# Patient Record
Sex: Male | Born: 1970 | Hispanic: No | Marital: Single | Smoking: Never smoker
Health system: Southern US, Community
[De-identification: ages and names within clinical notes are randomized; demographics above are authoritative.]

## PROBLEM LIST (undated history)

## (undated) DIAGNOSIS — F209 Schizophrenia, unspecified: Secondary | ICD-10-CM

## (undated) DIAGNOSIS — F79 Unspecified intellectual disabilities: Secondary | ICD-10-CM

## (undated) HISTORY — DX: Schizophrenia, unspecified: F20.9

---

## 1991-05-03 DIAGNOSIS — F209 Schizophrenia, unspecified: Secondary | ICD-10-CM

## 1991-05-03 HISTORY — DX: Schizophrenia, unspecified: F20.9

## 1998-02-12 ENCOUNTER — Encounter: Admission: RE | Admit: 1998-02-12 | Discharge: 1998-02-12 | Payer: Self-pay | Admitting: Family Medicine

## 1998-03-11 ENCOUNTER — Encounter: Admission: RE | Admit: 1998-03-11 | Discharge: 1998-03-11 | Payer: Self-pay | Admitting: Family Medicine

## 1998-03-23 ENCOUNTER — Encounter: Admission: RE | Admit: 1998-03-23 | Discharge: 1998-03-23 | Payer: Self-pay | Admitting: Family Medicine

## 1999-12-06 ENCOUNTER — Encounter: Admission: RE | Admit: 1999-12-06 | Discharge: 1999-12-06 | Payer: Self-pay | Admitting: Family Medicine

## 2003-09-22 ENCOUNTER — Encounter: Admission: RE | Admit: 2003-09-22 | Discharge: 2003-09-22 | Payer: Self-pay | Admitting: Family Medicine

## 2003-10-23 ENCOUNTER — Encounter: Admission: RE | Admit: 2003-10-23 | Discharge: 2003-10-23 | Payer: Self-pay | Admitting: Sports Medicine

## 2004-12-03 ENCOUNTER — Ambulatory Visit: Payer: Self-pay | Admitting: Family Medicine

## 2006-03-15 ENCOUNTER — Ambulatory Visit: Payer: Self-pay | Admitting: Family Medicine

## 2006-05-15 ENCOUNTER — Ambulatory Visit: Payer: Self-pay | Admitting: Family Medicine

## 2006-05-31 ENCOUNTER — Ambulatory Visit (HOSPITAL_COMMUNITY): Admission: RE | Admit: 2006-05-31 | Discharge: 2006-05-31 | Payer: Self-pay | Admitting: Sports Medicine

## 2006-06-01 ENCOUNTER — Ambulatory Visit: Payer: Self-pay | Admitting: Family Medicine

## 2006-06-29 DIAGNOSIS — F209 Schizophrenia, unspecified: Secondary | ICD-10-CM | POA: Insufficient documentation

## 2006-06-29 DIAGNOSIS — E669 Obesity, unspecified: Secondary | ICD-10-CM | POA: Insufficient documentation

## 2007-03-26 ENCOUNTER — Ambulatory Visit: Payer: Self-pay | Admitting: Family Medicine

## 2007-04-09 ENCOUNTER — Encounter (INDEPENDENT_AMBULATORY_CARE_PROVIDER_SITE_OTHER): Payer: Self-pay | Admitting: Family Medicine

## 2007-04-09 ENCOUNTER — Ambulatory Visit: Payer: Self-pay | Admitting: Family Medicine

## 2007-04-09 LAB — CONVERTED CEMR LAB
Cholesterol: 173 mg/dL (ref 0–200)
LDL Cholesterol: 112 mg/dL — ABNORMAL HIGH (ref 0–99)
Total CHOL/HDL Ratio: 3.6
VLDL: 13 mg/dL (ref 0–40)

## 2008-06-09 ENCOUNTER — Encounter: Payer: Self-pay | Admitting: Family Medicine

## 2008-06-09 ENCOUNTER — Ambulatory Visit: Payer: Self-pay | Admitting: Family Medicine

## 2008-06-10 LAB — CONVERTED CEMR LAB
Albumin: 4.4 g/dL (ref 3.5–5.2)
Alkaline Phosphatase: 77 units/L (ref 39–117)
BUN: 13 mg/dL (ref 6–23)
CO2: 27 meq/L (ref 19–32)
Calcium: 9.5 mg/dL (ref 8.4–10.5)
Chloride: 106 meq/L (ref 96–112)
Cholesterol: 174 mg/dL (ref 0–200)
Glucose, Bld: 82 mg/dL (ref 70–99)
HDL: 38 mg/dL — ABNORMAL LOW (ref >39)
Hemoglobin: 15.4 g/dL (ref 13.0–17.0)
LDL Cholesterol: 115 mg/dL — ABNORMAL HIGH (ref 0–99)
Potassium: 4.9 meq/L (ref 3.5–5.3)
RBC: 5.37 M/uL (ref 4.22–5.81)
Sodium: 142 meq/L (ref 135–145)
Total Protein: 7.2 g/dL (ref 6.0–8.3)
Triglycerides: 103 mg/dL (ref <150)
WBC: 4.3 10*3/uL (ref 4.0–10.5)

## 2009-06-15 ENCOUNTER — Ambulatory Visit: Payer: Self-pay | Admitting: Family Medicine

## 2009-06-15 ENCOUNTER — Encounter: Payer: Self-pay | Admitting: Family Medicine

## 2009-06-16 LAB — CONVERTED CEMR LAB
AST: 12 units/L (ref 0–37)
BUN: 11 mg/dL (ref 6–23)
Calcium: 8.9 mg/dL (ref 8.4–10.5)
Chloride: 105 meq/L (ref 96–112)
Cholesterol: 158 mg/dL (ref 0–200)
Creatinine, Ser: 0.97 mg/dL (ref 0.40–1.50)
HCT: 40.9 % (ref 39.0–52.0)
HDL: 37 mg/dL — ABNORMAL LOW (ref >39)
Hemoglobin: 14 g/dL (ref 13.0–17.0)
MCV: 84 fL (ref 78.0–100.0)
RDW: 15.5 % (ref 11.5–15.5)
Total Bilirubin: 0.6 mg/dL (ref 0.3–1.2)
Total CHOL/HDL Ratio: 4.3
VLDL: 11 mg/dL (ref 0–40)

## 2009-07-12 ENCOUNTER — Encounter: Payer: Self-pay | Admitting: Family Medicine

## 2010-04-13 ENCOUNTER — Encounter: Payer: Self-pay | Admitting: Family Medicine

## 2010-05-26 ENCOUNTER — Encounter: Payer: Self-pay | Admitting: Family Medicine

## 2010-05-26 ENCOUNTER — Ambulatory Visit: Admission: RE | Admit: 2010-05-26 | Discharge: 2010-05-26 | Payer: Self-pay | Source: Home / Self Care

## 2010-05-26 DIAGNOSIS — R635 Abnormal weight gain: Secondary | ICD-10-CM | POA: Insufficient documentation

## 2010-06-01 NOTE — Miscellaneous (Signed)
Summary: med update  Clinic notes obtained from [facility].  Medication updated............Marland KitchenMarisue Ivan, MD  Clinical Lists Changes  Medications: Changed medication from ZYPREXA 10 MG TABS (OLANZAPINE) one tablet by mouth daily to ZYPREXA 15 MG TABS (OLANZAPINE) 1 tab by mouth qhs

## 2010-06-01 NOTE — Assessment & Plan Note (Signed)
Summary: 40yo M wellness visit- 85lb wt loss   Vital Signs:  Patient profile:   40 year old male Height:      70.25 inches Weight:      165.6 pounds BMI:     23.68 Temp:     97.6 degrees F oral Pulse rate:   56 / minute BP sitting:   112 / 73  (left arm) Cuff size:   regular  Vitals Entered By: Gladstone Pih (June 15, 2009 2:01 PM) CC: CPE Is Patient Diabetic? No Pain Assessment Patient in pain? no        Primary Care Provider:  Marisue Ivan  MD  CC:  CPE.  History of Present Illness: 40yo M here for annual check up  Obesity: Lost 85lbs in 1 year w/ exercise and diet.    Schizophrenia: Doing well on the Zyprexa.  No hallucinations.  Followed at [facility].    Preventative: Desires flu vaccination.  Unsure of last tetanus.  Habits & Providers  Alcohol-Tobacco-Diet     Tobacco Status: never  Current Medications (verified): 1)  Zyprexa 10 Mg Tabs (Olanzapine) .... 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   Lives here in [place] with his mother but goes back to [place] during the year. No tobacco No EtOH No illcit drugs Exercise daily and Diet Best contact # [phone] (home), [phone] (mom's cell)  Review of Systems       No hallucinations.    Physical Exam  General:  VS Reviewed. Well appearing, NAD. Drastic weight loss since last visit.  Eyes:  EOMI Mouth:  Oral mucosa and oropharynx without lesions or exudates.  Teeth in good repair. Neck:  supple, full ROM, no goiter or mass  Lungs:  Normal respiratory effort, chest expands symmetrically. Lungs are clear to auscultation, no crackles or wheezes. Heart:  Normal  rate and regular rhythm. S1 and S2 normal without gallop, murmur, click, rub or other extra sounds. Abdomen:  Soft, NT, ND, no HSM, active BS excess skin  Extremities:  no edema Neurologic:  no focal deficits Cervical Nodes:  no LAD Psych:  no hallucinations nl affect   Impression & Recommendations:  Problem # 1:  WELL ADULT EXAM (ICD-V70.0) Assessment Unchanged  Pt is more healthy now than he was 1 year ago.  85lb wt loss with exercise and diet.  Will check CMET, CBC, and lipid panel given his hx of obesity and currently on zyprexa.  Will provide dtap and flu vaccination today.  Will f/u on an as needed basis.    Orders: [facility] - Est  18-39 yrs (30865)  Problem # 2:  SCHIZOPHRENIA (ICD-295.90) Assessment: Unchanged Well controlled on Zyrexa.  Followed by [facility].  Orders: Comp Met-[facility] [phone]) CBC-[facility] (84132)  Problem # 3:  OBESITY, NOS (ICD-278.00) Assessment: Improved 85lb wt loss w/ exercise and diet.  Orders: CBC-[facility] (44010) Lipid-[facility] ([id number])  Complete Medication List: 1)  Zyprexa 10 Mg Tabs (Olanzapine) .... One tablet by mouth daily  Patient Instructions: 1)  Please schedule a follow-up appointment as needed otherwise I will see you on an annual basis. 2)  Congratulations on the weight loss.   Prevention & Chronic Care Immunizations   Influenza  vaccine: Not documented    Tetanus booster: Not documented    Pneumococcal vaccine: Not documented  Other Screening   Smoking status: never  (06/15/2009)  Lipids   Total Cholesterol: 174  (06/09/2008)   LDL: 115  (06/09/2008)   LDL Direct: Not documented   HDL: 38  (06/09/2008)   Triglycerides: 103  (06/09/2008)   Nursing Instructions: Give Flu vaccine today Give tetanus booster today    Appended Document: 40yo M wellness visit- 85lb wt loss   Immunizations Administered:  Influenza Vaccine # 1:    Vaccine Type: Fluvax 3+    Site: left deltoid    Mfr: GlaxoSmithKline    Dose: 0.5  ml    Route: IM    Given by: Gladstone Pih    Exp. Date: 10/29/2009    Lot #: [id number]    VIS given: 01/2009    Physician counseled: yes  Tetanus Vaccine:    Vaccine Type: Tdap    Site: right deltoid    Mfr: GlaxoSmithKline    Dose: 0.5 ml    Route: IM    Given by: Gladstone Pih    Exp. Date: 06/27/2011    Lot #: [id number]    VIS given: 03/20/07 version given June 15, 2009.    Physician counseled: yes  Flu Vaccine Consent Questions:    Do you have a history of severe allergic reactions to this vaccine? no    Any prior history of allergic reactions to egg and/or gelatin? no    Do you have a sensitivity to the preservative Thimersol? no    Do you have a past history of Guillan-Barre Syndrome? no    Do you currently have an acute febrile illness? no    Have you ever had a severe reaction to latex? no    Vaccine information given and explained to patient? yes

## 2010-06-02 ENCOUNTER — Encounter: Payer: Self-pay | Admitting: *Deleted

## 2010-06-03 NOTE — Consult Note (Signed)
Summary: [facility]   Imported By: De Nurse 04/27/2010 11:32:41  _____________________________________________________________________  External Attachment:    Type:   Image     Comment:   External Document  Appended Document: [facility]    Clinical Lists Changes  Observations: Added new observation of PRIMECMT: WBC 3.1 (04/09/2010 14:03) Added new observation of PLATELETK/UL: 173 K/uL (04/09/2010 14:03) Added new observation of HGB: 13.3 g/dL (16/01/9603 54:09)       -  Date:  04/09/2010    HGB 13.3    PLTS 173    Primary Care Comment: WBC 3.1

## 2010-06-09 NOTE — Assessment & Plan Note (Signed)
Summary: CPE, schizophrenia, weight loss.    Vital Signs:  Patient profile:   40 year old male Height:      70.25 inches Weight:      150.8 pounds BMI:     21.56 Temp:     98.2 degrees F oral Pulse rate:   54 / minute BP sitting:   103 / 64  (left arm) Cuff size:   regular  Vitals Entered By: Jimmy Footman, CMA (May 26, 2010 1:57 PM) CC: CPE Is Patient Diabetic? No Pain Assessment Patient in pain? no        Primary Care Provider:  Jamie Brookes MD  CC:  CPE.  History of Present Illness: PT comes with a family member.   Mental Health: PT is a 40 y/o schizophrenic male who is well controlled on meds given by [facility]. He has been taking olanzepine and doing well on it but has had some leukopenia the last 2 times his labs were drawn. They are adjusting his meds as needed.   Weight loss: Pt has lost an amazing amount of weight. At his heaviest he weighed 353 lbs. He is now 150.8 lbs and feels well. He has done it by exercising and eating correctly.   Otherwise the patient is doing well and feeling well.   Habits & Providers  Alcohol-Tobacco-Diet     Tobacco Status: never  Current Medications (verified): 1)  Perphenazine 8 Mg Tabs (Perphenazine) .... Take 1 Pill Qhs  Allergies (verified): No Known Drug Allergies  Review of Systems        vitals reviewed and pertinent negatives and positives seen in HPI   Physical Exam  General:  Well-developed,well-nourished,in no acute distress; alert,appropriate and cooperative throughout examination Head:  Normocephalic and atraumatic without obvious abnormalities. No apparent alopecia or balding. Ears:  Rt ear cerumen removed, Tm's normal bilatearly Nose:  External nasal examination shows no deformity or inflammation. Nasal mucosa are pink and moist without lesions or exudates. Mouth:  Oral mucosa and oropharynx without lesions or exudates.  Teeth in good repair. Neck:  No deformities, masses, or tenderness  noted. Lungs:  Normal respiratory effort, chest expands symmetrically. Lungs are clear to auscultation, no crackles or wheezes. Heart:  Normal rate and regular rhythm. S1 and S2 normal without gallop, murmur, click, rub or other extra sounds. Abdomen:  Bowel sounds positive,abdomen soft and non-tender without masses, organomegaly or hernias noted. Msk:  No deformity or scoliosis noted of thoracic or lumbar spine.   Pulses:  R and L radial and posterior tibial pulses are full and equal bilaterally Extremities:  No clubbing, cyanosis, edema, or deformity noted with normal full range of motion of all joints.   Skin:  Intact without suspicious lesions or rashes Psych:  flat affect.     Impression & Recommendations:  Problem # 1:  WELL ADULT EXAM (ICD-V70.0) Assessment Unchanged Pt is doing well. He has no concerns or complaints.   Orders: [facility] - Est  18-39 yrs (16109)  Problem # 2:  WEIGHT LOSS (ICD-783.21) Assessment: Improved Plan to check a few labs to make sure the patient is still doing well with his weight loss. Will also check TSH. PT is not fasting today so he will get labs done in the future.   Orders: [facility] - Est  18-39 yrs ([id number])Future Orders: Lipid-[facility] ([id number]) ... 05/18/2011 Comp Met-[facility] ([id number]) ... 05/12/2011  Complete Medication List: 1)  Perphenazine 8 Mg Tabs (Perphenazine) .... Take 1 pill qhs  Other Orders:  TSH-[facility] [phone])   Orders Added: 1)  Lipid-[facility] [[id number]] 2)  Comp Met-[facility] [[id number]] 3)  TSH-[facility] [[id number]] 4)  [facility] - Est  18-39 yrs [84132]

## 2010-06-21 ENCOUNTER — Other Ambulatory Visit: Payer: Self-pay

## 2010-06-22 ENCOUNTER — Other Ambulatory Visit: Payer: Medicaid Other

## 2010-06-22 DIAGNOSIS — R634 Abnormal weight loss: Secondary | ICD-10-CM

## 2010-06-22 LAB — CONVERTED CEMR LAB
AST: 15 units/L (ref 0–37)
Alkaline Phosphatase: 51 units/L (ref 39–117)
BUN: 16 mg/dL (ref 6–23)
Calcium: 9.3 mg/dL (ref 8.4–10.5)
Creatinine, Ser: 0.93 mg/dL (ref 0.40–1.50)
HDL: 53 mg/dL (ref >39)
LDL Cholesterol: 69 mg/dL (ref 0–99)
Total Bilirubin: 0.7 mg/dL (ref 0.3–1.2)
Total CHOL/HDL Ratio: 2.5

## 2010-06-22 LAB — COMPREHENSIVE METABOLIC PANEL
CO2: 28 mEq/L (ref 19–32)
Creat: 0.93 mg/dL (ref 0.40–1.50)
Glucose, Bld: 77 mg/dL (ref 70–99)
Total Bilirubin: 0.7 mg/dL (ref 0.3–1.2)

## 2010-06-22 LAB — LIPID PANEL
Cholesterol: 132 mg/dL (ref 0–200)
HDL: 53 mg/dL (ref >39)
Total CHOL/HDL Ratio: 2.5 Ratio
Triglycerides: 52 mg/dL (ref <150)
VLDL: 10 mg/dL (ref 0–40)

## 2010-06-23 ENCOUNTER — Telehealth: Payer: Self-pay | Admitting: Family Medicine

## 2010-06-23 NOTE — Telephone Encounter (Signed)
Informed pt of nl results.Charles Hernandez

## 2010-09-23 ENCOUNTER — Encounter: Payer: Self-pay | Admitting: Sports Medicine

## 2010-09-23 ENCOUNTER — Ambulatory Visit (INDEPENDENT_AMBULATORY_CARE_PROVIDER_SITE_OTHER): Payer: Medicaid Other | Admitting: Sports Medicine

## 2010-09-23 VITALS — BP 92/60 | HR 62 | Temp 97.4°F | Ht 70.0 in | Wt 155.0 lb

## 2010-09-23 DIAGNOSIS — K6289 Other specified diseases of anus and rectum: Secondary | ICD-10-CM | POA: Insufficient documentation

## 2010-09-23 MED ORDER — ALUMINUM CHLORIDE 20 % EX SOLN: Freq: Every day | CUTANEOUS | Status: DC

## 2010-09-23 NOTE — Patient Instructions (Signed)
Great to see you, Use the medication below as prescribed. Come back to see me if no better in 2 weeks. If the medication is too expensive you may try a deodorant with aluminum such as Gilette Clinical strength.  Ihor Austin. Benjamin Stain, M.D.

## 2010-09-23 NOTE — Progress Notes (Signed)
  Subjective:    Patient ID: Charles Hernandez, male    DOB: 01/13/1971, 40 y.o.   MRN: [id number]  HPI Skin irritation:  Pt notes that for 1-2 weeks the skin around his rectum has been peeling.  Notes this when bathing and when wiping after stooling.  No pain when stooling, no fevers/chills.  Lost >200 lbs recently and has lots of skin hanging.  Notes sweats a lot particularly since the weather has changed.   Review of Systems See HPI    Objective:   Physical Exam  Constitutional: He appears well-developed and well-nourished. No distress.  Genitourinary:     Skin: Skin is warm and dry.          Assessment & Plan:

## 2010-09-23 NOTE — Assessment & Plan Note (Signed)
Gluteal skin causing sweating->maceration/peeling.   Pt to try to keep skin dry/clean. Will rx Drysol topical, if too expensive may use deodorant containing aluminum such as Gilette clinical strength in gluteal cleft. RTC if no better in 2 weeks and can consider oral treatment such as robinul.

## 2010-10-12 ENCOUNTER — Telehealth: Payer: Self-pay | Admitting: Family Medicine

## 2010-10-12 DIAGNOSIS — K6289 Other specified diseases of anus and rectum: Secondary | ICD-10-CM

## 2010-10-12 NOTE — Telephone Encounter (Signed)
I can't speak to him now but if he could just tell my nurse how things are going then I can follow up with him that way.

## 2010-10-12 NOTE — Telephone Encounter (Signed)
Pt called to speak with you regarding progress for his skin irritation.  Please call him back at contact number given or his mom's cell # at [phone]

## 2010-10-20 MED ORDER — ALUMINUM CHLORIDE 20 % EX SOLN: Freq: Every day | CUTANEOUS | Status: AC

## 2010-10-20 NOTE — Telephone Encounter (Signed)
Multiple refills called in.

## 2010-10-20 NOTE — Telephone Encounter (Signed)
Pt said that he is healing and would probably need another refill just in case he have another outbreak.

## 2010-10-21 NOTE — Telephone Encounter (Signed)
Informed pt that rx was called in.Laureen Ochs, Viann Shove

## 2011-06-01 ENCOUNTER — Encounter: Payer: Medicaid Other | Admitting: Family Medicine

## 2011-06-22 ENCOUNTER — Encounter: Payer: Self-pay | Admitting: Family Medicine

## 2011-06-22 ENCOUNTER — Ambulatory Visit (INDEPENDENT_AMBULATORY_CARE_PROVIDER_SITE_OTHER): Payer: Medicaid Other | Admitting: Family Medicine

## 2011-06-22 ENCOUNTER — Telehealth: Payer: Self-pay | Admitting: *Deleted

## 2011-06-22 DIAGNOSIS — L708 Other acne: Secondary | ICD-10-CM

## 2011-06-22 DIAGNOSIS — L709 Acne, unspecified: Secondary | ICD-10-CM

## 2011-06-22 DIAGNOSIS — Z0000 Encounter for general adult medical examination without abnormal findings: Secondary | ICD-10-CM

## 2011-06-22 DIAGNOSIS — J069 Acute upper respiratory infection, unspecified: Secondary | ICD-10-CM

## 2011-06-22 DIAGNOSIS — D72829 Elevated white blood cell count, unspecified: Secondary | ICD-10-CM

## 2011-06-22 LAB — CBC WITH DIFFERENTIAL/PLATELET
Basophils Relative: 0 % (ref 0–1)
Eosinophils Absolute: 0.3 10*3/uL (ref 0.0–0.7)
Eosinophils Relative: 4 % (ref 0–5)
Lymphs Abs: 1.6 10*3/uL (ref 0.7–4.0)
MCH: 30.3 pg (ref 26.0–34.0)
MCHC: 33.3 g/dL (ref 30.0–36.0)
MCV: 91 fL (ref 78.0–100.0)
Platelets: 257 10*3/uL (ref 150–400)
RBC: 5.22 MIL/uL (ref 4.22–5.81)
RDW: 13.7 % (ref 11.5–15.5)

## 2011-06-22 LAB — COMPREHENSIVE METABOLIC PANEL
ALT: 14 U/L (ref 0–53)
Alkaline Phosphatase: 84 U/L (ref 39–117)
CO2: 28 mEq/L (ref 19–32)
Creat: 0.91 mg/dL (ref 0.50–1.35)
Total Bilirubin: 0.5 mg/dL (ref 0.3–1.2)

## 2011-06-22 MED ORDER — CLINDAMYCIN PHOS-BENZOYL PEROX 1.2-5 % EX GEL: 45.0000 g | Freq: Two times a day (BID) | CUTANEOUS | Status: AC

## 2011-06-22 NOTE — Patient Instructions (Signed)
It was nice to meet you today. Call our office in 48 hours for lab results. Go to local pharmacy and pick up Duac ointment for your skin and apply twice daily. You may purchase the following over the counter medications: - Fiber supplements or Miralax for constipation - Cetaphil or Eucerin facial moisturizer for dry skin - Tylenol 500 mg 1 tablet every 8 hours as needed for headache - DayQuil and/or Nyquil for headache, runny, nose - Cepacol cough drops for sore throat  Please return to clinic in 3-4 weeks for follow up. Schedule an appointment with Dr. Gerilyn Pilgrim for Nutrition.

## 2011-06-22 NOTE — Telephone Encounter (Signed)
lvm informing pt that clindamycin is not covered with his Insurance. I will speak with Dr.de Lawson Radar about an alternative.Loralee Pacas [place]

## 2011-06-23 MED ORDER — TRETINOIN 0.01 % EX GEL: Freq: Every day | CUTANEOUS | Status: AC

## 2011-06-23 NOTE — Telephone Encounter (Signed)
Let's try this medication instead.  I sent Rx to his pharmacy.  Please inform.  Thanks.

## 2011-06-24 ENCOUNTER — Telehealth: Payer: Self-pay | Admitting: *Deleted

## 2011-06-24 NOTE — Telephone Encounter (Signed)
PA required for tretinoin gel. Form placed in MD box.

## 2011-06-26 ENCOUNTER — Encounter: Payer: Self-pay | Admitting: Family Medicine

## 2011-06-26 DIAGNOSIS — L709 Acne, unspecified: Secondary | ICD-10-CM | POA: Insufficient documentation

## 2011-06-26 DIAGNOSIS — J069 Acute upper respiratory infection, unspecified: Secondary | ICD-10-CM | POA: Insufficient documentation

## 2011-06-26 NOTE — Progress Notes (Signed)
  Subjective:    Patient ID: Charles Hernandez, male    DOB: January 14, 1971, 41 y.o.   MRN: [id number]  HPI  Patient presents to clinic to meet new MD and annual physical.  He complains of acne and worsening break outs thsi winter and would like to a referral to dermatologist.  He has a photo shoot in late April and wants clear skin prior to the shoot.  He also complains of congestion, sore throat, and runny nose that started 3 days ago.  He was visiting family last weekend and his nephew was sick.  Otherwise, patient denies any fever, chills, NS, chest pain, SOB, abdominal pain, diarrhea, or dysuria.  He is constipated but admits to not eating a high fiber diet.  Patient also wants blood work checked today and faxed to his psychiatrist @ [place] [phone]).    Review of Systems  PER HPI    Objective:   Physical Exam  Constitutional: No distress.  HENT:  Head: Normocephalic and atraumatic.  Neck: Normal range of motion.  Cardiovascular: Normal heart sounds.   No murmur heard. Pulmonary/Chest: Effort normal and breath sounds normal. He has no wheezes. He has no rales.  Abdominal: Soft. Bowel sounds are normal. He exhibits no distension. There is no tenderness.  Musculoskeletal: Normal range of motion. He exhibits no edema and no tenderness.  Neurological: He is alert. No cranial nerve deficit.  Skin:       Dry skin and pimples worse on R side of face > L side.  No cystic acne, redness, or pus.          Assessment & Plan:

## 2011-06-26 NOTE — Assessment & Plan Note (Signed)
Conservative management and red flags given.  Refer to AVS

## 2011-06-26 NOTE — Assessment & Plan Note (Signed)
Duac gel for breakouts and cetaphil or eucerin facial lotion BID for dry skin.

## 2011-06-29 NOTE — Telephone Encounter (Signed)
Approval received from insurance.  Pharmacy notified. 

## 2011-07-20 ENCOUNTER — Ambulatory Visit: Payer: Medicaid Other | Admitting: Family Medicine

## 2012-05-07 ENCOUNTER — Ambulatory Visit (INDEPENDENT_AMBULATORY_CARE_PROVIDER_SITE_OTHER): Payer: Medicare Other | Admitting: *Deleted

## 2012-05-07 DIAGNOSIS — Z23 Encounter for immunization: Secondary | ICD-10-CM

## 2012-05-19 ENCOUNTER — Encounter (HOSPITAL_COMMUNITY): Payer: Self-pay

## 2012-05-19 ENCOUNTER — Emergency Department (INDEPENDENT_AMBULATORY_CARE_PROVIDER_SITE_OTHER)
Admission: EM | Admit: 2012-05-19 | Discharge: 2012-05-19 | Disposition: A | Discharge status: Home / Self Care | Payer: Medicare Other | Source: Home / Self Care | Attending: Family Medicine | Admitting: Family Medicine

## 2012-05-19 DIAGNOSIS — K0889 Other specified disorders of teeth and supporting structures: Secondary | ICD-10-CM

## 2012-05-19 DIAGNOSIS — K089 Disorder of teeth and supporting structures, unspecified: Secondary | ICD-10-CM

## 2012-05-19 HISTORY — DX: Unspecified intellectual disabilities: F79

## 2012-05-19 MED ORDER — DICLOFENAC POTASSIUM 50 MG PO TABS: 50.0000 mg | ORAL_TABLET | Freq: Three times a day (TID) | ORAL | Status: DC

## 2012-05-19 MED ORDER — CLINDAMYCIN HCL 150 MG PO CAPS: 150.0000 mg | ORAL_CAPSULE | Freq: Four times a day (QID) | ORAL | Status: DC

## 2012-05-19 NOTE — ED Notes (Signed)
Reportedly has been told one of his lower right molars needs to be removed; pain has been worse past 2-3 days , no relief w OTC medication; no appointment yet for dental care

## 2012-05-19 NOTE — ED Provider Notes (Signed)
History     CSN: [id number]  Arrival date & time 05/19/12  1146   First MD Initiated Contact with Patient 05/19/12 1149      Chief Complaint  Patient presents with  . Dental Pain    (Consider location/radiation/quality/duration/timing/severity/associated sxs/prior treatment) Patient is a 42 y.o. male presenting with tooth pain. The history is provided by the patient.  Dental PainThe primary symptoms include mouth pain. Primary symptoms do not include headaches or fever. The symptoms began 3 to 5 days ago. The symptoms are worsening.  Additional symptoms include: dental sensitivity to temperature.    Past Medical History  Diagnosis Date  . Mental disability     History reviewed. No pertinent past surgical history.  History reviewed. No pertinent family history.  History  Substance Use Topics  . Smoking status: Never Smoker   . Smokeless tobacco: Not on file  . Alcohol Use: Not on file      Review of Systems  Constitutional: Negative.  Negative for fever.  HENT: Positive for dental problem.   Neurological: Negative for headaches.    Allergies  Review of patient's allergies indicates no known allergies.  Home Medications   Current Outpatient Rx  Name  Route  Sig  Dispense  Refill  . CLINDAMYCIN HCL 150 MG PO CAPS   Oral   Take 1 capsule (150 mg total) by mouth 4 (four) times daily.   28 capsule   0   . CLINDAMYCIN PHOS-BENZOYL PEROX 1.2-5 % EX GEL   Topical   Apply 45 g topically 2 (two) times daily.   45 g   0   . DICLOFENAC POTASSIUM 50 MG PO TABS   Oral   Take 1 tablet (50 mg total) by mouth 3 (three) times daily. For dental pain   30 tablet   0   . OLANZAPINE 15 MG PO TABS   Oral   Take 15 mg by mouth at bedtime.           . TRETINOIN 0.01 % EX GEL   Topical   Apply topically at bedtime.   45 g   0     BP 110/76  Pulse 53  Temp 98.8 F (37.1 C) (Oral)  Resp 16  SpO2 97%  Physical Exam  Nursing note and vitals  reviewed. Constitutional: He appears well-developed and well-nourished.  HENT:  Head: Normocephalic.  Right Ear: External ear normal.  Left Ear: External ear normal.  Mouth/Throat: Oropharynx is clear and moist. No oropharyngeal exudate.      ED Course  Procedures (including critical care time)  Labs Reviewed - No data to display No results found.   1. Pain, dental       MDM          Linna Hoff, MD 05/19/12 [phone]

## 2012-05-21 NOTE — ED Notes (Signed)
[facility] called re: pt stating diclofenac is too expensive. Per Dr. Artis Flock, rx changed to Ibuprofen 800mg  #30 TID as needed for pain control. Pharmacist notified of this changed and to destroy the diclofenac rx.

## 2012-08-24 ENCOUNTER — Encounter: Payer: Self-pay | Admitting: Family Medicine

## 2012-08-24 ENCOUNTER — Ambulatory Visit (INDEPENDENT_AMBULATORY_CARE_PROVIDER_SITE_OTHER): Payer: Medicare Other | Admitting: Family Medicine

## 2012-08-24 VITALS — BP 103/52 | HR 54 | Temp 99.0°F | Wt 243.0 lb

## 2012-08-24 DIAGNOSIS — E669 Obesity, unspecified: Secondary | ICD-10-CM

## 2012-08-24 DIAGNOSIS — R635 Abnormal weight gain: Secondary | ICD-10-CM

## 2012-08-24 DIAGNOSIS — F209 Schizophrenia, unspecified: Secondary | ICD-10-CM

## 2012-08-24 LAB — COMPREHENSIVE METABOLIC PANEL
Albumin: 3.8 g/dL (ref 3.5–5.2)
CO2: 26 mEq/L (ref 19–32)
Glucose, Bld: 70 mg/dL (ref 70–99)
Potassium: 4.5 mEq/L (ref 3.5–5.3)
Sodium: 140 mEq/L (ref 135–145)
Total Bilirubin: 0.7 mg/dL (ref 0.3–1.2)
Total Protein: 6.5 g/dL (ref 6.0–8.3)

## 2012-08-24 LAB — CBC
Platelets: 239 10*3/uL (ref 150–400)
RBC: 4.94 MIL/uL (ref 4.22–5.81)
WBC: 3.9 10*3/uL — ABNORMAL LOW (ref 4.0–10.5)

## 2012-08-24 LAB — TSH: TSH: 2.012 u[IU]/mL (ref 0.350–4.500)

## 2012-08-24 MED ORDER — TRETINOIN MICROSPHERE 0.1 % EX GEL: Freq: Every day | CUTANEOUS | Status: DC

## 2012-08-24 NOTE — Progress Notes (Signed)
  Subjective:    Patient ID: Charles Hernandez, male    DOB: 11/06/1970, 41 y.o.   MRN: [id number]  HPI  Patient presents to clinic for annual physical.  Weight gain:  Patient says he he has gained weight ever since he was in [place].  He said he was sick with a viral bug for a few weeks.  He was seen at an Urgent Care center in [place], but does not recall the diagnosis.  He admits to not being very active since he has been recovering from illness, but plans to start exercise regimen and eat better.  Denies any viral symptoms at this time.  Schizophrenia:  Patient has a psychiatrist.  He takes Zyprexa and has no complaints.  Denies any visual or auditory hallucinations.  He needs blood work today to be sent to psychiatrist.  No other concerns.  Past Medical History  Diagnosis Date  . Mental disability   . Schizophrenia 1993   Family History  Problem Relation Age of Onset  . Cancer Mother   . Cancer Sister    He lives in [place] now, but visits family in [place] often.  He says he is looking for a job.  Never smoker.  . Review of Systems Per HPI    Objective:   Physical Exam  Constitutional: He appears well-nourished. No distress.  HENT:  Head: Normocephalic and atraumatic.  Right Ear: External ear normal.  Left Ear: External ear normal.  Mouth/Throat: Oropharynx is clear and moist.  Eyes: Conjunctivae are normal. No scleral icterus.  Cardiovascular: Normal rate.   Pulmonary/Chest: Effort normal and breath sounds normal.  Abdominal: Soft. He exhibits no distension. There is no tenderness.  Musculoskeletal: Normal range of motion.  Neurological: No cranial nerve deficit.  Skin: No rash noted.  Psychiatric: He has a normal mood and affect. His behavior is normal. Thought content normal.      Assessment & Plan:

## 2012-08-24 NOTE — Patient Instructions (Addendum)
It was great to see you again. I will send you a letter with your recent lab results. Retin-A gel was sent to your pharmacy.  Call if too expensive. Schedule next annual physical in ONE year.  Preventive Care for Adults, Male A healthy lifestyle and preventive care can promote health and wellness. Preventive health guidelines for men include the following key practices:  A routine yearly physical is a good way to check with your caregiver about your health and preventative screening. It is a chance to share any concerns and updates on your health, and to receive a thorough exam.  Visit your dentist for a routine exam and preventative care every 6 months. Brush your teeth twice a day and floss once a day. Good oral hygiene prevents tooth decay and gum disease.  The frequency of eye exams is based on your age, health, family medical history, use of contact lenses, and other factors. Follow your caregiver's recommendations for frequency of eye exams.  Eat a healthy diet. Foods like vegetables, fruits, whole grains, low-fat dairy products, and lean protein foods contain the nutrients you need without too many calories. Decrease your intake of foods high in solid fats, added sugars, and salt. Eat the right amount of calories for you.Get information about a proper diet from your caregiver, if necessary.  Regular physical exercise is one of the most important things you can do for your health. Most adults should get at least 150 minutes of moderate-intensity exercise (any activity that increases your heart rate and causes you to sweat) each week. In addition, most adults need muscle-strengthening exercises on 2 or more days a week.  Maintain a healthy weight. The body mass index (BMI) is a screening tool to identify possible weight problems. It provides an estimate of body fat based on height and weight. Your caregiver can help determine your BMI, and can help you achieve or maintain a healthy  weight.For adults 20 years and older:  A BMI below 18.5 is considered underweight.  A BMI of 18.5 to 24.9 is normal.  A BMI of 25 to 29.9 is considered overweight.  A BMI of 30 and above is considered obese.  Maintain normal blood lipids and cholesterol levels by exercising and minimizing your intake of saturated fat. Eat a balanced diet with plenty of fruit and vegetables. Blood tests for lipids and cholesterol should begin at age 14 and be repeated every 5 years. If your lipid or cholesterol levels are high, you are over 50, or you are a high risk for heart disease, you may need your cholesterol levels checked more frequently.Ongoing high lipid and cholesterol levels should be treated with medicines if diet and exercise are not effective.  If you smoke, find out from your caregiver how to quit. If you do not use tobacco, do not start.  If you choose to drink alcohol, do not exceed 2 drinks per day. One drink is considered to be 12 ounces (355 mL) of beer, 5 ounces (148 mL) of wine, or 1.5 ounces (44 mL) of liquor.  Avoid use of street drugs. Do not share needles with anyone. Ask for help if you need support or instructions about stopping the use of drugs.  High blood pressure causes heart disease and increases the risk of stroke. Your blood pressure should be checked at least every 1 to 2 years. Ongoing high blood pressure should be treated with medicines, if weight loss and exercise are not effective.  If you are 45 to  41 years old, ask your caregiver if you should take aspirin to prevent heart disease.  Diabetes screening involves taking a blood sample to check your fasting blood sugar level. This should be done once every 3 years, after age 16, if you are within normal weight and without risk factors for diabetes. Testing should be considered at a younger age or be carried out more frequently if you are overweight and have at least 1 risk factor for diabetes.  Colorectal cancer can be  detected and often prevented. Most routine colorectal cancer screening begins at the age of 63 and continues through age 46. However, your caregiver may recommend screening at an earlier age if you have risk factors for colon cancer. On a yearly basis, your caregiver may provide home test kits to check for hidden blood in the stool. Use of a small camera at the end of a tube, to directly examine the colon (sigmoidoscopy or colonoscopy), can detect the earliest forms of colorectal cancer. Talk to your caregiver about this at age 5, when routine screening begins. Direct examination of the colon should be repeated every 5 to 10 years through age 35, unless early forms of pre-cancerous polyps or small growths are found.  Hepatitis C blood testing is recommended for all people born from [birth year] through 1965 and any individual with known risks for hepatitis C.  Practice safe sex. Use condoms and avoid high-risk sexual practices to reduce the spread of sexually transmitted infections (STIs). STIs include gonorrhea, chlamydia, syphilis, trichomonas, herpes, HPV, and human immunodeficiency virus (HIV). Herpes, HIV, and HPV are viral illnesses that have no cure. They can result in disability, cancer, and death.  A one-time screening for abdominal aortic aneurysm (AAA) and surgical repair of large AAAs by sound wave imaging (ultrasonography) is recommended for ages 19 to 14 years who are current or former smokers.  Healthy men should no longer receive prostate-specific antigen (PSA) blood tests as part of routine cancer screening. Consult with your caregiver about prostate cancer screening.  Testicular cancer screening is not recommended for adult males who have no symptoms. Screening includes self-exam, caregiver exam, and other screening tests. Consult with your caregiver about any symptoms you have or any concerns you have about testicular cancer.  Use sunscreen with skin protection factor (SPF) of 30 or more.  Apply sunscreen liberally and repeatedly throughout the day. You should seek shade when your shadow is shorter than you. Protect yourself by wearing long sleeves, pants, a wide-brimmed hat, and sunglasses year round, whenever you are outdoors.  Once a month, do a whole body skin exam, using a mirror to look at the skin on your back. Notify your caregiver of new moles, moles that have irregular borders, moles that are larger than a pencil eraser, or moles that have changed in shape or color.  Stay current with required immunizations.  Influenza. You need a dose every fall (or winter). The composition of the flu vaccine changes each year, so being vaccinated once is not enough.  Pneumococcal polysaccharide. You need 1 to 2 doses if you smoke cigarettes or if you have certain chronic medical conditions. You need 1 dose at age 74 (or older) if you have never been vaccinated.  Tetanus, diphtheria, pertussis (Tdap, Td). Get 1 dose of Tdap vaccine if you are younger than age 43 years, are over 31 and have contact with an infant, are a Research scientist (physical sciences), or simply want to be protected from whooping cough. After that, you need a  Td booster dose every 10 years. Consult your caregiver if you have not had at least 3 tetanus and diphtheria-containing shots sometime in your life or have a deep or dirty wound.  HPV. This vaccine is recommended for males 13 through 41 years of age. This vaccine may be given to men 22 through 41 years of age who have not completed the 3 dose series. It is recommended for men through age 38 who have sex with men or whose immune system is weakened because of HIV infection, other illness, or medications. The vaccine is given in 3 doses over 6 months.  Measles, mumps, rubella (MMR). You need at least 1 dose of MMR if you were born in 1957 or later. You may also need a 2nd dose.  Meningococcal. If you are age 64 to 40 years and a Orthoptist living in a residence hall, or  have one of several medical conditions, you need to get vaccinated against meningococcal disease. You may also need additional booster doses.  Zoster (shingles). If you are age 47 years or older, you should get this vaccine.  Varicella (chickenpox). If you have never had chickenpox or you were vaccinated but received only 1 dose, talk to your caregiver to find out if you need this vaccine.  Hepatitis A. You need this vaccine if you have a specific risk factor for hepatitis A virus infection, or you simply wish to be protected from this disease. The vaccine is usually given as 2 doses, 6 to 18 months apart.  Hepatitis B. You need this vaccine if you have a specific risk factor for hepatitis B virus infection or you simply wish to be protected from this disease. The vaccine is given in 3 doses, usually over 6 months. Preventative Service / Frequency Ages 69 to 80  Blood pressure check.** / Every 1 to 2 years.  Lipid and cholesterol check.** / Every 5 years beginning at age 26.  Hepatitis C blood test.** / For any individual with known risks for hepatitis C.  Skin self-exam. / Monthly.  Influenza immunization.** / Every year.  Pneumococcal polysaccharide immunization.** / 1 to 2 doses if you smoke cigarettes or if you have certain chronic medical conditions.  Tetanus, diphtheria, pertussis (Tdap,Td) immunization. / A one-time dose of Tdap vaccine. After that, you need a Td booster dose every 10 years.  HPV immunization. / 3 doses over 6 months, if 26 and younger.  Measles, mumps, rubella (MMR) immunization. / You need at least 1 dose of MMR if you were born in 1957 or later. You may also need a 2nd dose.  Meningococcal immunization. / 1 dose if you are age 37 to 21 years and a Orthoptist living in a residence hall, or have one of several medical conditions, you need to get vaccinated against meningococcal disease. You may also need additional booster doses.  Varicella  immunization.** / Consult your caregiver.  Hepatitis A immunization.** / Consult your caregiver. 2 doses, 6 to 18 months apart.  Hepatitis B immunization.** / Consult your caregiver. 3 doses usually over 6 months. Ages 40 to 10  Blood pressure check.** / Every 1 to 2 years.  Lipid and cholesterol check.** / Every 5 years beginning at age 32.  Fecal occult blood test (FOBT) of stool. / Every year beginning at age 51 and continuing until age 48. You may not have to do this test if you get colonoscopy every 10 years.  Flexible sigmoidoscopy** or colonoscopy.** / Every  5 years for a flexible sigmoidoscopy or every 10 years for a colonoscopy beginning at age 62 and continuing until age 33.  Hepatitis C blood test.** / For all people born from [birth year] through 1965 and any individual with known risks for hepatitis C.  Skin self-exam. / Monthly.  Influenza immunization.** / Every year.  Pneumococcal polysaccharide immunization.** / 1 to 2 doses if you smoke cigarettes or if you have certain chronic medical conditions.  Tetanus, diphtheria, pertussis (Tdap/Td) immunization.** / A one-time dose of Tdap vaccine. After that, you need a Td booster dose every 10 years.  Measles, mumps, rubella (MMR) immunization. / You need at least 1 dose of MMR if you were born in 1957 or later. You may also need a 2nd dose.  Varicella immunization.**/ Consult your caregiver.  Meningococcal immunization.** / Consult your caregiver.  Hepatitis A immunization.** / Consult your caregiver. 2 doses, 6 to 18 months apart.  Hepatitis B immunization.** / Consult your caregiver. 3 doses, usually over 6 months. Ages 32 and over  Blood pressure check.** / Every 1 to 2 years.  Lipid and cholesterol check.**/ Every 5 years beginning at age 79.  Fecal occult blood test (FOBT) of stool. / Every year beginning at age 62 and continuing until age 5. You may not have to do this test if you get colonoscopy every 10  years.  Flexible sigmoidoscopy** or colonoscopy.** / Every 5 years for a flexible sigmoidoscopy or every 10 years for a colonoscopy beginning at age 51 and continuing until age 76.  Hepatitis C blood test.** / For all people born from [birth year] through 1964 and any individual with known risks for hepatitis C.  Abdominal aortic aneurysm (AAA) screening.** / A one-time screening for ages 77 to 32 years who are current or former smokers.  Skin self-exam. / Monthly.  Influenza immunization.** / Every year.  Pneumococcal polysaccharide immunization.** / 1 dose at age 52 (or older) if you have never been vaccinated.  Tetanus, diphtheria, pertussis (Tdap, Td) immunization. / A one-time dose of Tdap vaccine if you are over 65 and have contact with an infant, are a Research scientist (physical sciences), or simply want to be protected from whooping cough. After that, you need a Td booster dose every 10 years.  Varicella immunization. ** / Consult your caregiver.  Meningococcal immunization.** / Consult your caregiver.  Hepatitis A immunization. ** / Consult your caregiver. 2 doses, 6 to 18 months apart.  Hepatitis B immunization.** / Check with your caregiver. 3 doses, usually over 6 months. **Family history and personal history of risk and conditions may change your caregiver's recommendations. Document Released: 06/14/2001 Document Revised: 07/11/2011 Document Reviewed: 09/13/2010 [employer] Patient Information 2013 [place], [place].

## 2012-08-27 ENCOUNTER — Encounter: Payer: Self-pay | Admitting: Family Medicine

## 2012-08-27 DIAGNOSIS — E669 Obesity, unspecified: Secondary | ICD-10-CM | POA: Insufficient documentation

## 2012-08-27 NOTE — Assessment & Plan Note (Signed)
Followed by psychiatry.  Reviewed labs and sent 2 copies to patient, one for psychiatrist.  He has an appointment this month.

## 2012-08-27 NOTE — Assessment & Plan Note (Signed)
Weight gain could be secondary to Zyprexa, but he admits to sedentary lifestyle and seems motivated to lose weight.  Consider getting TSH at next visit.  Counseled patient on lifestyle modifications.  Next physical in one year.

## 2013-08-12 DIAGNOSIS — F200 Paranoid schizophrenia: Secondary | ICD-10-CM | POA: Diagnosis not present

## 2013-10-03 ENCOUNTER — Encounter: Payer: Self-pay | Admitting: Family Medicine

## 2013-10-03 ENCOUNTER — Ambulatory Visit (INDEPENDENT_AMBULATORY_CARE_PROVIDER_SITE_OTHER): Payer: Medicare Other | Admitting: Family Medicine

## 2013-10-03 VITALS — BP 108/79 | HR 58 | Temp 98.8°F | Ht 70.0 in | Wt 280.0 lb

## 2013-10-03 DIAGNOSIS — F209 Schizophrenia, unspecified: Secondary | ICD-10-CM

## 2013-10-03 DIAGNOSIS — R634 Abnormal weight loss: Secondary | ICD-10-CM | POA: Diagnosis not present

## 2013-10-03 DIAGNOSIS — R635 Abnormal weight gain: Secondary | ICD-10-CM

## 2013-10-03 DIAGNOSIS — E669 Obesity, unspecified: Secondary | ICD-10-CM | POA: Diagnosis not present

## 2013-10-03 DIAGNOSIS — Z0000 Encounter for general adult medical examination without abnormal findings: Secondary | ICD-10-CM | POA: Diagnosis not present

## 2013-10-03 DIAGNOSIS — F205 Residual schizophrenia: Secondary | ICD-10-CM | POA: Diagnosis not present

## 2013-10-03 DIAGNOSIS — K089 Disorder of teeth and supporting structures, unspecified: Secondary | ICD-10-CM | POA: Diagnosis not present

## 2013-10-03 LAB — COMPREHENSIVE METABOLIC PANEL
ALT: 14 U/L (ref 0–53)
AST: 18 U/L (ref 0–37)
Albumin: 4.1 g/dL (ref 3.5–5.2)
Alkaline Phosphatase: 86 U/L (ref 39–117)
BILIRUBIN TOTAL: 0.6 mg/dL (ref 0.2–1.2)
BUN: 14 mg/dL (ref 6–23)
CO2: 26 mEq/L (ref 19–32)
Calcium: 9.4 mg/dL (ref 8.4–10.5)
Chloride: 104 mEq/L (ref 96–112)
Creat: 1.06 mg/dL (ref 0.50–1.35)
Glucose, Bld: 83 mg/dL (ref 70–99)
Potassium: 4.5 mEq/L (ref 3.5–5.3)
SODIUM: 139 meq/L (ref 135–145)
TOTAL PROTEIN: 7.1 g/dL (ref 6.0–8.3)

## 2013-10-03 LAB — LIPID PANEL
CHOL/HDL RATIO: 4.4 ratio
Cholesterol: 192 mg/dL (ref 0–200)
HDL: 44 mg/dL (ref >39)
LDL Cholesterol: 136 mg/dL — ABNORMAL HIGH (ref 0–99)
TRIGLYCERIDES: 61 mg/dL (ref <150)
VLDL: 12 mg/dL (ref 0–40)

## 2013-10-03 LAB — CBC WITH DIFFERENTIAL/PLATELET
BASOS ABS: 0 10*3/uL (ref 0.0–0.1)
Basophils Relative: 0 % (ref 0–1)
EOS ABS: 0.4 10*3/uL (ref 0.0–0.7)
Eosinophils Relative: 9 % — ABNORMAL HIGH (ref 0–5)
HCT: 44.3 % (ref 39.0–52.0)
Hemoglobin: 15.5 g/dL (ref 13.0–17.0)
Lymphocytes Relative: 24 % (ref 12–46)
Lymphs Abs: 1.1 10*3/uL (ref 0.7–4.0)
MCH: 29.6 pg (ref 26.0–34.0)
MCHC: 35 g/dL (ref 30.0–36.0)
MCV: 84.7 fL (ref 78.0–100.0)
Monocytes Absolute: 0.3 10*3/uL (ref 0.1–1.0)
Monocytes Relative: 7 % (ref 3–12)
NEUTROS PCT: 60 % (ref 43–77)
Neutro Abs: 2.8 10*3/uL (ref 1.7–7.7)
PLATELETS: 263 10*3/uL (ref 150–400)
RBC: 5.23 MIL/uL (ref 4.22–5.81)
RDW: 15 % (ref 11.5–15.5)
WBC: 4.6 10*3/uL (ref 4.0–10.5)

## 2013-10-03 LAB — TSH: TSH: 2.421 u[IU]/mL (ref 0.350–4.500)

## 2013-10-03 NOTE — Patient Instructions (Signed)
Great t o meet you!  I'll send you a letter with you rlabs in a day or two, We'll call if there are any concern

## 2013-10-03 NOTE — Assessment & Plan Note (Signed)
37 pound weight gain since last visit about one year ago Possibly due to Zyprexa Discussed lifestyle changes, sounds like eating has been out of control lately

## 2013-10-03 NOTE — Assessment & Plan Note (Signed)
All of by psychiatry, sees [facility] Zyprexa possibly contribute to his weight gain Check labs today: Lipids, CMP, CBC, he would like a phone call. Affect slightly pressured today but denies SI, HI, hallucinations Encouraged followup with [facility] as usual

## 2013-10-03 NOTE — Progress Notes (Signed)
Patient ID: Charles Hernandez, male   DOB: 1970-10-01, 43 y.o.   MRN: [id number]  Kevin Fenton, MD Phone: [phone]  Subjective:  Chief complaint-noted  Pt Here for annual exam and followup for schizophrenia and weight gain.  Schizophrenia Denies seeing any visual or auditory hallucinations at this time, he sees telemedicine psychiatrist had [facility] and he is on Zyprexa. He states he's had trouble sleeping lately is very concerned about his white blood cell count.  Concern for diabetes He states he's gained 80 pounds in the last year or so niece has diabetes in his family history Retested today He denies polyuria but does endorses polydipsia and polyphagia. States hes fasting   Weight gain States that he is concerned about diabetes, he denies new medications, reports good compliance with Zyprexa. States that his diet has been abnormal in the last few months it has been in [place] with family eating and partying a lot. States that he started running and wants to lose weight, he is very commited to losing weight.    ROS- Per HPI  Past Medical History Patient Active Problem List   Diagnosis Date Noted  . PE (physical exam), annual 10/03/2013  . Morbid obesity 10/03/2013  . Acne 06/26/2011  . URI (upper respiratory infection) 06/26/2011  . Weight gain 05/26/2010  . 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 OLANZapine (ZYPREXA) 15 MG tablet Take 15 mg by mouth at bedtime.        . clindamycin (CLEOCIN) 150 MG capsule Take 1 capsule (150 mg total) by mouth 4 (four) times daily.  28 capsule  0  . diclofenac (CATAFLAM) 50 MG tablet Take 1 tablet (50 mg total) by mouth 3 (three) times daily. For dental pain  30 tablet  0  . hydrOXYzine (VISTARIL) 25 MG capsule 25 mg.      . tretinoin microspheres (RETIN-A MICRO) 0.1 % gel Apply topically at bedtime.  45 g  0   No current  facility-administered medications for this visit.    Objective: BP 108/79  Pulse 58  Temp(Src) 98.8 F (37.1 C) (Oral)  Ht 5\' 10"  (1.778 m)  Wt 280 lb (127.007 kg)  BMI 40.18 kg/m2 Gen: NAD, alert, cooperative with exam, obese HEENT: NCAT CV: RRR, good S1/S2, no murmur Resp: CTABL, no wheezes, non-labored Abd: SNTND, BS present, no guarding or organomegaly Ext: No edema, warm Neuro: Alert and oriented, No gross deficits Psych: No SI, NO HI, Slept last night, slightly pressured,  Skin: Slight acanthosis nigricans in bilateral neck area   Assessment/Plan:  SCHIZOPHRENIA All of by psychiatry, sees [facility] Zyprexa possibly contribute to his weight gain Check labs today: Lipids, CMP, CBC, he would like a phone call. Affect slightly pressured today but denies SI, HI, hallucinations Encouraged followup with [facility] as usual  Morbid obesity 37 pound weight gain since last visit about one year ago Possibly due to Zyprexa Discussed lifestyle changes, sounds like eating has been out of control lately     Orders Placed This Encounter  Procedures  . CBC with Differential  . Comprehensive metabolic panel  . Lipid Panel  . TSH

## 2013-10-04 ENCOUNTER — Encounter: Payer: Self-pay | Admitting: Family Medicine

## 2013-10-04 ENCOUNTER — Telehealth: Payer: Self-pay | Admitting: Family Medicine

## 2013-10-04 NOTE — Telephone Encounter (Signed)
Called to discuss labs, he would like a copy mailed to him.   Murtis Sink, MD [facility] Resident, PGY-2 10/04/2013, 2:37 PM

## 2013-10-23 DIAGNOSIS — F209 Schizophrenia, unspecified: Secondary | ICD-10-CM | POA: Diagnosis not present

## 2013-10-24 ENCOUNTER — Encounter: Payer: Self-pay | Admitting: Family Medicine

## 2013-10-24 ENCOUNTER — Ambulatory Visit (INDEPENDENT_AMBULATORY_CARE_PROVIDER_SITE_OTHER): Payer: Medicare Other | Admitting: Family Medicine

## 2013-10-24 VITALS — BP 146/89 | HR 61 | Ht 70.0 in | Wt 278.0 lb

## 2013-10-24 DIAGNOSIS — E669 Obesity, unspecified: Secondary | ICD-10-CM | POA: Diagnosis not present

## 2013-10-24 DIAGNOSIS — F209 Schizophrenia, unspecified: Secondary | ICD-10-CM | POA: Diagnosis not present

## 2013-10-24 DIAGNOSIS — Z0000 Encounter for general adult medical examination without abnormal findings: Secondary | ICD-10-CM | POA: Diagnosis not present

## 2013-10-24 DIAGNOSIS — R634 Abnormal weight loss: Secondary | ICD-10-CM | POA: Diagnosis not present

## 2013-10-24 DIAGNOSIS — L659 Nonscarring hair loss, unspecified: Secondary | ICD-10-CM | POA: Diagnosis not present

## 2013-10-24 DIAGNOSIS — R635 Abnormal weight gain: Secondary | ICD-10-CM | POA: Diagnosis not present

## 2013-10-24 DIAGNOSIS — K089 Disorder of teeth and supporting structures, unspecified: Secondary | ICD-10-CM | POA: Diagnosis not present

## 2013-10-24 NOTE — Progress Notes (Signed)
   Subjective:    Patient ID: Charles Hernandez, male    DOB: 09/01/1970, 43 y.o.   MRN: [id number]  HPI: Pt presents to clinic for SDA for scalp problems. He notes that his hair is thinned / receeding along the front hairline. He also states that his weight is significantly increased and is concerned that his poor diet and stress for "personal reasons" is contributing. He recently had a haircut, which is when he noted the difference in his hairline. He has noted no rashes on his scalp or otherwise. He has no fevers / chills. He is very concerned / anxious about this. He reports that he does rub at his head / temples "a lot," and "a lot, lately," "for long minutes at a time, maybe ten or twenty times a day," due to increased stress / anxiety, lately, secondary to "personal problems." He has been back and forth to [place] and is leaving tomorrow for another trip up there. He requests "a blood test, medication, maybe a referral to dermatology, whatever you think is necessary." He has been to a dermatologist in the remote past, for similar issues, but had no identifiable problems at that time.  Review of Systems: As above. He denies fever / chills, coryza-type symptoms, sick contacts, other skin or body hair issues, rashes.     Objective:   Physical Exam BP 146/89  Pulse 61  Ht 5\' 10"  (1.778 m)  Wt 278 lb (126.1 kg)  BMI 39.89 kg/m2 Gen: well-appearing adult male in NAD Scalp: Close-cut/shaved hair with some thinning of hair at anterior hairline, bilaterally toward temples  No rash / flaking / other lesions noted  No frank patches of hair loss, no broken hairs or areas of dry skin Skin: otherwise clear without rashes     Assessment & Plan:  A: Thinned hair in two areas noted as above, but no frank abnormalities to suggest infection. Possibly secondary to rubbing at head with stress or hair being simply cut closer to the skin in these areas. Also possible component of worried-well patient with history  of schizophrenia, though no obvious psychosis / AH / VH / etc, today. Strongly doubt serious pathology related to hair, at this time.  P: Discussed options with pt and explained that I feel dermatology referral and lab testing at this time is not warranted. With pt's assistance, came to decision to allow hair to grow out for a few weeks to see if the areas of concern are truly thinned out. Advised pt that he should avoid rubbing at his hair / head, and to see if he can reduce his stress levels to see if that helps, as well. Pt may try OTC Rogaine or similar product if he desires, in the meantime. Pt will f/u with Dr. Ermalinda MemosBradshaw in a few weeks; explained to pt that he may consider referring pt to dermatology at that time IF it is warranted. Pt expressed agreement, understanding, and appreciation.

## 2013-10-24 NOTE — Patient Instructions (Signed)
Thank you for coming in, today!  You do have some thinning hair at the edges of your scalp line. I think it could be related to rubbing at it and/or your stress. It could also just be related to getting your hair cut (the two spots might just be cut shorter).  For now, let your hair grow back out some to see if it grows back out normal. Avoid rubbing at your hair.  You can try Rogaine over the counter as well.  Come back to see Dr. Ermalinda MemosBradshaw in several weeks. Depending on what he sees, he might recommend seeing a dermatologist. Please feel free to call with any questions or concerns at any time, at [phone]. --Dr. Casper HarrisonStreet

## 2013-10-30 ENCOUNTER — Telehealth: Payer: Self-pay | Admitting: Family Medicine

## 2013-10-30 NOTE — Telephone Encounter (Signed)
To red team .me

## 2013-10-30 NOTE — Telephone Encounter (Signed)
"  This is very important. He needs to call me today.  I have question about vitamins."

## 2013-10-31 NOTE — Telephone Encounter (Signed)
Called to discuss vitamin for hair growth. I advised that a multivitamin is likely the best think he could take. Also advised rogaine, he has not tried this. Agreed with Dr. Casper HarrisonStreet that Derm and blood work is not warranted yet.   Murtis SinkSam Vonetta Foulk, MD [facility] Resident, PGY-3 10/31/2013, 1:52 PM

## 2013-10-31 NOTE — Telephone Encounter (Signed)
Spoke with patient and he has an upcoming appointment with Dr. Ermalinda MemosBradshaw, but was wondering due to his hair loss is there anything like vitamins he could take. He states that this was discussed at visit with him, also he stated that he seen Dr. Casper HarrisonStreet and was advised to excersise to help with hair growth?

## 2013-11-15 DIAGNOSIS — R51 Headache: Secondary | ICD-10-CM | POA: Diagnosis not present

## 2013-11-15 DIAGNOSIS — S0990XA Unspecified injury of head, initial encounter: Secondary | ICD-10-CM | POA: Diagnosis not present

## 2013-11-15 DIAGNOSIS — S0100XA Unspecified open wound of scalp, initial encounter: Secondary | ICD-10-CM | POA: Diagnosis not present

## 2013-11-18 DIAGNOSIS — F209 Schizophrenia, unspecified: Secondary | ICD-10-CM | POA: Diagnosis not present

## 2013-11-21 ENCOUNTER — Ambulatory Visit (INDEPENDENT_AMBULATORY_CARE_PROVIDER_SITE_OTHER): Payer: Medicare Other | Admitting: Family Medicine

## 2013-11-21 ENCOUNTER — Encounter: Payer: Self-pay | Admitting: Family Medicine

## 2013-11-21 VITALS — BP 145/99 | HR 58 | Temp 98.1°F | Wt 283.0 lb

## 2013-11-21 DIAGNOSIS — Z0000 Encounter for general adult medical examination without abnormal findings: Secondary | ICD-10-CM | POA: Diagnosis not present

## 2013-11-21 DIAGNOSIS — S0990XA Unspecified injury of head, initial encounter: Secondary | ICD-10-CM | POA: Diagnosis not present

## 2013-11-21 DIAGNOSIS — E669 Obesity, unspecified: Secondary | ICD-10-CM | POA: Diagnosis not present

## 2013-11-21 DIAGNOSIS — H579 Unspecified disorder of eye and adnexa: Secondary | ICD-10-CM | POA: Diagnosis not present

## 2013-11-21 DIAGNOSIS — R634 Abnormal weight loss: Secondary | ICD-10-CM | POA: Diagnosis not present

## 2013-11-21 DIAGNOSIS — K089 Disorder of teeth and supporting structures, unspecified: Secondary | ICD-10-CM | POA: Diagnosis not present

## 2013-11-21 DIAGNOSIS — F209 Schizophrenia, unspecified: Secondary | ICD-10-CM | POA: Diagnosis not present

## 2013-11-21 DIAGNOSIS — L659 Nonscarring hair loss, unspecified: Secondary | ICD-10-CM

## 2013-11-21 DIAGNOSIS — R635 Abnormal weight gain: Secondary | ICD-10-CM | POA: Diagnosis not present

## 2013-11-21 DIAGNOSIS — H5789 Other specified disorders of eye and adnexa: Secondary | ICD-10-CM

## 2013-11-21 NOTE — Patient Instructions (Signed)
Great to see you again!  Make an appt for a nurses visit tomorrow to get your sutures removed tomorrow.   You should be called for your referrals.

## 2013-11-21 NOTE — Assessment & Plan Note (Signed)
Patient feels has had a rapid or loss recently As been recommended to use Rogaine, however has not used. 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 Would like second opinion in dermatology. Dermatology referral written

## 2013-11-21 NOTE — Assessment & Plan Note (Signed)
Date intermittent right eye eructation with no vision loss Visual fields full confrontation bilaterally Patient requests, 3 ophthalmology referral, as his previous optometrist does not take medication any longer

## 2013-11-21 NOTE — Assessment & Plan Note (Signed)
No signs of concussion Suture removal in 7 days tomorrow with nurses visit.

## 2013-11-21 NOTE — Progress Notes (Signed)
Patient ID: Charles Hernandez, male   DOB: 1971/01/20, 43 y.o.   MRN: [id number]  Kevin FentonSamuel Detrich Rakestraw, MD Phone: [phone]  Subjective:  Chief complaint-noted  Pt Here for followup head injury, hair loss, eye eructation  Eye irritation Right eye irritation described as mild bothersome symptoms that self resolved since. He previously had an optometrist versus ophthalmologist which take Medicaid and no longer does. He requests an optometry referral today.  Hair loss  states it has been rapidly happening over the last 2 months. It mostly in his bilateral temporal areas creating a window speech. No itching or discoloration the area Other physician has recommended Rogaine which he has not tried yet.  Head injury Tripped and fell hitting his head 6 days ago when he was on her recent trip to [place]. He had profuse bleeding but no loss of consciousness, bleeding stopped easily when he got sutures placed. Would like to get them removed tomorrow He denies any headaches problems walking or confusion since that time.  Patient is back from her recent trip to [place], he states that he lives in [place] and here and goes back and forth. He is going back in about a week.  ROS-  Past Medical History Patient Active Problem List   Diagnosis Date Noted  . Hair loss 11/21/2013  . Head injury, unspecified 11/21/2013  . Eye irritation 11/21/2013  . PE (physical exam), annual 10/03/2013  . Morbid obesity 10/03/2013  . Acne 06/26/2011  . URI (upper respiratory infection) 06/26/2011  . Weight gain 05/26/2010  . 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 clindamycin (CLEOCIN) 150 MG capsule Take 1 capsule (150 mg total) by mouth 4 (four) times daily.  28 capsule  0  . diclofenac (CATAFLAM) 50 MG tablet Take 1 tablet (50 mg total) by mouth 3 (three) times daily. For dental pain  30 tablet  0  . hydrOXYzine  (VISTARIL) 25 MG capsule 25 mg.      . OLANZapine (ZYPREXA) 15 MG tablet Take 15 mg by mouth at bedtime.        . tretinoin microspheres (RETIN-A MICRO) 0.1 % gel Apply topically at bedtime.  45 g  0   No current facility-administered medications for this visit.    Objective: BP 145/99  Pulse 58  Temp(Src) 98.1 F (36.7 C) (Oral)  Wt 283 lb (128.368 kg) Gen: NAD, alert, cooperative with exam HEENT: NCAT, EOMI, visual fields full to confrontation bilaterally CV: RRR, good S1/S2, no murmur Resp: CTABL, no wheezes, non-labored Ext: No edema, warm Neuro: Alert and oriented, No gross deficits   Assessment/Plan:  Hair loss Patient feels has had a rapid or loss recently As been recommended to use Rogaine, however has not used. 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 Would like second opinion in dermatology. Dermatology referral written  Head injury, unspecified No signs of concussion Suture removal in 7 days tomorrow with nurses visit.  Eye irritation Date intermittent right eye eructation with no vision loss Visual fields full confrontation bilaterally Patient requests, 3 ophthalmology referral, as his previous optometrist does not take medication any longer   Orders Placed This Encounter  Procedures  . Ambulatory referral to Optometry    Referral Priority:  Routine    Referral Type:  Vision Training and development officer(Optometry)    Referral Reason:  Specialty Services Required  Requested Specialty:  Optometry    Number of Visits Requested:  1  . Ambulatory referral to Dermatology    Referral Priority:  Routine    Referral Type:  Consultation    Referral Reason:  Specialty Services Required    Requested Specialty:  Dermatology    Number of Visits Requested:  1

## 2013-11-22 ENCOUNTER — Ambulatory Visit (INDEPENDENT_AMBULATORY_CARE_PROVIDER_SITE_OTHER): Payer: Medicare Other | Admitting: *Deleted

## 2013-11-22 DIAGNOSIS — R635 Abnormal weight gain: Secondary | ICD-10-CM | POA: Diagnosis not present

## 2013-11-22 DIAGNOSIS — L659 Nonscarring hair loss, unspecified: Secondary | ICD-10-CM | POA: Diagnosis not present

## 2013-11-22 DIAGNOSIS — S0990XA Unspecified injury of head, initial encounter: Secondary | ICD-10-CM | POA: Diagnosis not present

## 2013-11-22 DIAGNOSIS — Z0000 Encounter for general adult medical examination without abnormal findings: Secondary | ICD-10-CM | POA: Diagnosis not present

## 2013-11-22 DIAGNOSIS — R634 Abnormal weight loss: Secondary | ICD-10-CM | POA: Diagnosis not present

## 2013-11-22 DIAGNOSIS — F209 Schizophrenia, unspecified: Secondary | ICD-10-CM | POA: Diagnosis not present

## 2013-11-22 DIAGNOSIS — E669 Obesity, unspecified: Secondary | ICD-10-CM | POA: Diagnosis not present

## 2013-11-22 DIAGNOSIS — H579 Unspecified disorder of eye and adnexa: Secondary | ICD-10-CM | POA: Diagnosis not present

## 2013-11-22 DIAGNOSIS — K089 Disorder of teeth and supporting structures, unspecified: Secondary | ICD-10-CM | POA: Diagnosis not present

## 2013-11-22 NOTE — Progress Notes (Signed)
   Pt in nurse clinic for suture removal.  Pt had sutures placed in scalp about 10 days ago in [place].  Pt stated he fell down stairs.  Pt stated there where 6 sutures placed only 5 sutures observed and removed.  Pt thought one of the suture fell out in the shower this AM.  Sutures removed without difficult.  No drainage, swelling or redness noted.  Precepted with Dr. McDiarmid; assessed pt area; agreed with nurse.  Clovis PuMartin, Lenaya Pietsch L, RN

## 2013-11-26 ENCOUNTER — Telehealth: Payer: Self-pay | Admitting: *Deleted

## 2013-11-26 NOTE — Telephone Encounter (Signed)
Left message to return call. Please tell patient she has an appointment at [facility] in [place] on 01/24/14 at 10:15.Busick, Rodena Medinobert Lee

## 2013-11-27 NOTE — Telephone Encounter (Signed)
Pt and mother were given the appt info. Blount, Deseree CMA

## 2014-04-15 DIAGNOSIS — F209 Schizophrenia, unspecified: Secondary | ICD-10-CM | POA: Diagnosis not present

## 2014-04-18 DIAGNOSIS — L905 Scar conditions and fibrosis of skin: Secondary | ICD-10-CM | POA: Diagnosis not present

## 2014-04-18 DIAGNOSIS — L649 Androgenic alopecia, unspecified: Secondary | ICD-10-CM | POA: Diagnosis not present

## 2014-07-15 DIAGNOSIS — F209 Schizophrenia, unspecified: Secondary | ICD-10-CM | POA: Diagnosis not present

## 2014-08-04 ENCOUNTER — Ambulatory Visit (INDEPENDENT_AMBULATORY_CARE_PROVIDER_SITE_OTHER): Payer: Medicare Other | Admitting: Obstetrics and Gynecology

## 2014-08-04 ENCOUNTER — Encounter: Payer: Self-pay | Admitting: Obstetrics and Gynecology

## 2014-08-04 VITALS — BP 119/84 | HR 59 | Temp 98.6°F | Ht 70.0 in | Wt 298.0 lb

## 2014-08-04 DIAGNOSIS — L918 Other hypertrophic disorders of the skin: Secondary | ICD-10-CM

## 2014-08-04 DIAGNOSIS — L989 Disorder of the skin and subcutaneous tissue, unspecified: Secondary | ICD-10-CM

## 2014-08-04 DIAGNOSIS — Z0000 Encounter for general adult medical examination without abnormal findings: Secondary | ICD-10-CM | POA: Diagnosis not present

## 2014-08-04 NOTE — Patient Instructions (Signed)
Mr. Charles Hernandez it was great to meet you today.  We performed a skin tag removal on you today. Please monitor for any signs of infections. Avoid wetting the area for at least 24hrs.  Please RTC if you are concerned about any drainage or signs of infection.  Claritin or Zyrtec is the al;lergy medication you can try over the counter.   Please schedule a follow-up appointment with Dr. Ermalinda MemosBradshaw to discuss any other concerns.   Thanks for allowing me to be a part of your care! Dr. Doroteo GlassmanPhelps

## 2014-08-04 NOTE — Progress Notes (Signed)
Subjective:     Patient ID: Charles Hernandez, male   DOB: 1970-09-10, 44 y.o.   MRN: [id number]  HPI The patient complains of symptomatic skin tags on the back of his neck. These are irritated by clothing and rubbing. He states that they have been there all his life. Believe they have just now become irritated due to patient recently going to the gym more often to lose weight and believes the sauna and steam rooms are worsening skin tags. Now it is snagging everything. Noticed that one of the skin tags in particular feels like it was burnt and started having a crust on it.  Would like the main one on back of the neck that is irritated removed. Wondering if he would have any residual scar.    Review of Systems  Constitutional: Positive for activity change and fatigue. Negative for fever.  Respiratory: Negative for shortness of breath.   Cardiovascular: Negative for chest pain.  Gastrointestinal: Negative for abdominal pain.  Allergic/Immunologic: Positive for environmental allergies.      Objective:   Physical Exam  Constitutional: He appears well-developed and well-nourished.  Obese AA male.  Skin: Skin is warm, dry and intact. No rash noted.  Multiple benign skin tags over trunk measuring <633mm. Skin tag on back of neck pedunculated, erythematous, and with outer crust.       Assessment:     Benign skin tags over trunk. One skin tag on left base of posterior neck inflamed and irritated.     Plan:     Skin tags: Multiple benign skin tags. Removed the one irritated skin tag on back of neck. Patient given intructions on when to RTC.      Procedure note: Skin tag was shaved off using alcohol wipe for cleansing and sterile rounded blade for removal. Local anesthesia was not used. The pathognomonic lesion was not sent for pathology. Patient tolerated procedure well.

## 2014-08-04 NOTE — Progress Notes (Deleted)
Subjective:     Patient ID: Charles Hernandez, male   DOB: [birth year], 44 y.o.   MRN: [id number]  HPI   Review of Systems     Objective:   Physical Exam     Assessment:     ***    Plan:     ***

## 2014-09-30 DIAGNOSIS — F209 Schizophrenia, unspecified: Secondary | ICD-10-CM | POA: Diagnosis not present

## 2014-10-07 DIAGNOSIS — F4322 Adjustment disorder with anxiety: Secondary | ICD-10-CM | POA: Diagnosis not present

## 2014-10-07 DIAGNOSIS — F209 Schizophrenia, unspecified: Secondary | ICD-10-CM | POA: Diagnosis not present

## 2014-12-18 ENCOUNTER — Encounter: Payer: Self-pay | Admitting: Family Medicine

## 2014-12-18 ENCOUNTER — Ambulatory Visit (INDEPENDENT_AMBULATORY_CARE_PROVIDER_SITE_OTHER): Payer: Medicare Other | Admitting: Family Medicine

## 2014-12-18 VITALS — BP 132/95 | HR 108 | Temp 98.1°F | Wt 301.0 lb

## 2014-12-18 DIAGNOSIS — L918 Other hypertrophic disorders of the skin: Secondary | ICD-10-CM | POA: Diagnosis present

## 2014-12-18 DIAGNOSIS — Z0000 Encounter for general adult medical examination without abnormal findings: Secondary | ICD-10-CM | POA: Diagnosis not present

## 2014-12-18 NOTE — Progress Notes (Signed)
Patient ID: Charles Hernandez, male   DOB: 11-Aug-1970, 44 y.o.   MRN: [id number]    S: The patient complains of symptomatic skin tags on the back of his neck and shoulder. These are irritated by clothing, jewelry and rubbing. It has been there all his life. He recently had one tag removed. He will like to get all removed.  O: Patient appears well. Several benign 3 small skin tags are noted on the back of his neck measuring <0.5 cm each, one located on left and two on the right side of his neck, closer to his shoulders. A larger tag was seen on his back on the right. No sign suggestive of infection or inflammation.  A: Skin tags   P: 3 small Skin tags are snipped off using Betadine for cleansing and sterile scalpel after written consent was obtained. Local anesthesia 2% lidocaine wasused. These pathognomonic lesions are not sent for pathology.Patient to see PCP soon for removal of the 4th larger tag.

## 2014-12-18 NOTE — Patient Instructions (Signed)
It was nice seeing you today. We removed three small tags from your back. It bled a little. Please keep the bandage on for few hours. Follow up with PCP for other health concern.  Incision Care  An incision is a surgical cut to open your skin. You need to take care of your incision. This helps you to not get an infection. HOME CARE  Only take medicine as told by your doctor.  Do not take off your bandage (dressing) or get your incision wet until your doctor approves. Change the bandage and call your doctor if the bandage gets wet, dirty, or starts to smell.  Take showers. Do not take baths, swim, or do anything that may soak your incision until it heals.  Return to your normal diet and activities as told or allowed by your doctor.  Avoid lifting any weight until your doctor approves.  Put medicine that helps lessen itching on your incision as told by your doctor. Do not pick or scratch at your incision.  Keep your doctor visit to have your stitches (sutures) or staples removed.  Drink enough fluids to keep your pee (urine) clear or pale yellow. GET HELP RIGHT AWAY IF:  You have a fever.  You have a rash.  You are dizzy, or you pass out (faint) while standing.  You have trouble breathing.  You have a reaction or side effects to medicine given to you.  You have redness, puffiness (swelling), or more pain in the incision and medicine does not help.  You have fluid, blood, or yellowish-white fluid (pus) coming from the incision lasting over 1 day.  You have muscle aches, chills, or you feel sick.  You have a bad smell coming from the incision or bandage.  Your incision opens up after stitches, staples, or sticky strips have been removed.  You keep feeling sick to your stomach (nauseous) or keep throwing up (vomiting). MAKE SURE YOU:   Understand these instructions.  Will watch your condition.  Will get help right away if you are not doing well or get worse. Document  Released: 07/11/2011 Document Reviewed: 06/12/2013 [facility] Patient Information 2015 [place], [place]. This information is not intended to replace advice given to you by your health care provider. Make sure you discuss any questions you have with your health care provider.

## 2014-12-25 ENCOUNTER — Ambulatory Visit (INDEPENDENT_AMBULATORY_CARE_PROVIDER_SITE_OTHER): Payer: Medicare Other | Admitting: Family Medicine

## 2014-12-25 ENCOUNTER — Encounter: Payer: Self-pay | Admitting: Family Medicine

## 2014-12-25 VITALS — BP 134/84 | HR 64 | Temp 98.6°F | Ht 70.0 in | Wt 296.0 lb

## 2014-12-25 DIAGNOSIS — R631 Polydipsia: Secondary | ICD-10-CM | POA: Diagnosis not present

## 2014-12-25 DIAGNOSIS — R739 Hyperglycemia, unspecified: Secondary | ICD-10-CM

## 2014-12-25 DIAGNOSIS — L918 Other hypertrophic disorders of the skin: Secondary | ICD-10-CM | POA: Diagnosis present

## 2014-12-25 DIAGNOSIS — Z0000 Encounter for general adult medical examination without abnormal findings: Secondary | ICD-10-CM | POA: Diagnosis not present

## 2014-12-25 LAB — POCT GLYCOSYLATED HEMOGLOBIN (HGB A1C): Hemoglobin A1C: 5.5

## 2014-12-25 NOTE — Progress Notes (Signed)
   Subjective:    Patient ID: Charles Hernandez, male    DOB: 01/20/1971, 44 y.o.   MRN: [id number]  Seen for Same day visit for   CC: skin tag f/u  Here today for follow-up after skin tag removal.  He denies any fevers, chills, redness or pain.  He does not want any additional skin tags removed at this time  Review of Systems   See HPI for ROS. Objective:  BP 134/84 mmHg  Pulse 64  Temp(Src) 98.6 F (37 C) (Oral)  Ht  (1.778 m)  Wt 296 lb (134.265 kg)  BMI 42.47 kg/m2  General: NAD Skin: clean, healing well w/o signs of infection    Assessment & Plan:  See Problem List Documentation

## 2014-12-25 NOTE — Patient Instructions (Signed)
It was great seeing you today.   1. Your skin tags are healing well. Keep clean with soap and water.  2. I will check for diabetes and call you with the results.    Please bring all your medications to every doctors visit  Sign up for My Chart to have easy access to your labs results, and communication with your Primary care physician.  If you have any questions or concerns before then, please call the clinic at [phone].  Take Care,   Dr Wenda Low

## 2014-12-25 NOTE — Assessment & Plan Note (Signed)
Follow-up for skin tag removal.  No signs of infection.  He declines additional skin tag removal at this time - Advised keeping area clean and dry.  Follow-up as needed

## 2015-01-02 DIAGNOSIS — F4322 Adjustment disorder with anxiety: Secondary | ICD-10-CM | POA: Diagnosis not present

## 2015-01-02 DIAGNOSIS — F209 Schizophrenia, unspecified: Secondary | ICD-10-CM | POA: Diagnosis not present

## 2015-01-09 ENCOUNTER — Ambulatory Visit (INDEPENDENT_AMBULATORY_CARE_PROVIDER_SITE_OTHER): Payer: Medicare Other | Admitting: Family Medicine

## 2015-01-09 ENCOUNTER — Encounter: Payer: Self-pay | Admitting: Family Medicine

## 2015-01-09 VITALS — BP 139/77 | HR 66 | Temp 98.4°F | Ht 70.0 in | Wt 299.0 lb

## 2015-01-09 DIAGNOSIS — L659 Nonscarring hair loss, unspecified: Secondary | ICD-10-CM

## 2015-01-09 DIAGNOSIS — L918 Other hypertrophic disorders of the skin: Secondary | ICD-10-CM | POA: Diagnosis present

## 2015-01-09 DIAGNOSIS — Z79899 Other long term (current) drug therapy: Secondary | ICD-10-CM

## 2015-01-09 DIAGNOSIS — H6122 Impacted cerumen, left ear: Secondary | ICD-10-CM

## 2015-01-09 DIAGNOSIS — H539 Unspecified visual disturbance: Secondary | ICD-10-CM

## 2015-01-09 DIAGNOSIS — Z0000 Encounter for general adult medical examination without abnormal findings: Secondary | ICD-10-CM | POA: Diagnosis not present

## 2015-01-09 DIAGNOSIS — Z20828 Contact with and (suspected) exposure to other viral communicable diseases: Secondary | ICD-10-CM

## 2015-01-09 LAB — COMPREHENSIVE METABOLIC PANEL
ALT: 16 U/L (ref 9–46)
AST: 17 U/L (ref 10–40)
Albumin: 4.1 g/dL (ref 3.6–5.1)
Alkaline Phosphatase: 97 U/L (ref 40–115)
BUN: 15 mg/dL (ref 7–25)
CALCIUM: 9.1 mg/dL (ref 8.6–10.3)
CHLORIDE: 104 mmol/L (ref 98–110)
CO2: 27 mmol/L (ref 20–31)
Creat: 1.02 mg/dL (ref 0.60–1.35)
GLUCOSE: 75 mg/dL (ref 65–99)
POTASSIUM: 4.6 mmol/L (ref 3.5–5.3)
Sodium: 140 mmol/L (ref 135–146)
Total Bilirubin: 0.6 mg/dL (ref 0.2–1.2)
Total Protein: 6.9 g/dL (ref 6.1–8.1)

## 2015-01-09 LAB — CBC WITH DIFFERENTIAL/PLATELET
BASOS PCT: 0 % (ref 0–1)
Basophils Absolute: 0 10*3/uL (ref 0.0–0.1)
EOS ABS: 0.4 10*3/uL (ref 0.0–0.7)
Eosinophils Relative: 8 % — ABNORMAL HIGH (ref 0–5)
HCT: 43.8 % (ref 39.0–52.0)
Hemoglobin: 15.1 g/dL (ref 13.0–17.0)
Lymphocytes Relative: 24 % (ref 12–46)
Lymphs Abs: 1.1 10*3/uL (ref 0.7–4.0)
MCH: 29.2 pg (ref 26.0–34.0)
MCHC: 34.5 g/dL (ref 30.0–36.0)
MCV: 84.7 fL (ref 78.0–100.0)
MPV: 10.1 fL (ref 8.6–12.4)
Monocytes Absolute: 0.3 10*3/uL (ref 0.1–1.0)
Monocytes Relative: 7 % (ref 3–12)
Neutro Abs: 2.9 10*3/uL (ref 1.7–7.7)
Neutrophils Relative %: 61 % (ref 43–77)
Platelets: 262 10*3/uL (ref 150–400)
RBC: 5.17 MIL/uL (ref 4.22–5.81)
RDW: 15.3 % (ref 11.5–15.5)
WBC: 4.7 10*3/uL (ref 4.0–10.5)

## 2015-01-09 LAB — TESTOSTERONE: TESTOSTERONE: 480 ng/dL (ref 300–890)

## 2015-01-09 LAB — LIPID PANEL
CHOL/HDL RATIO: 4.2 ratio (ref <=5.0)
CHOLESTEROL: 198 mg/dL (ref 125–200)
HDL: 47 mg/dL (ref >=40)
LDL CALC: 134 mg/dL — AB (ref <130)
TRIGLYCERIDES: 83 mg/dL (ref <150)
VLDL: 17 mg/dL (ref <30)

## 2015-01-09 LAB — TSH: TSH: 1.888 u[IU]/mL (ref 0.350–4.500)

## 2015-01-09 LAB — HIV ANTIBODY (ROUTINE TESTING W REFLEX): HIV 1&2 Ab, 4th Generation: NONREACTIVE

## 2015-01-09 NOTE — Progress Notes (Signed)
Charles Hillier, MD, MS Phone: [phone]  Subjective:  Chief complaint -- Annual Physical  Pt Here for annual physical  He has brought a list with him today for discussion. Most of his list are requests for blood test for his "kidneys" "liver" and "hair loss". He also has some questions about his stretch marks, and reports some recent hearing issues. This has been going on for ~1 yr. No trauma or history of prolong loud/noisey environment. Primarily Lt sided.   Patient also requests a bone density scan, as he is concerned he is, or will soon be, decreasing in size.   Patient and I discussed these requests (more importantly, the goals of these requests) at length. I also discussed with him the appropriateness of performing and not performing some exams. Patient was pleased to hear some tests were not appropriate.  Tobacco Use: no   Alcohol Use: no  Other Drugs: no   Support Structure: yes  Life at Home: stable  ROS- denies weakness, fatigue, fevers, chills, HAs, n/v/d, weight loss, appetite changes, joint/muscle pain, hematuria, hematochezia, melena.   Past Medical History Patient Active Problem List   Diagnosis Date Noted  . Cerumen impaction 01/11/2015  . Vision changes 01/11/2015  . Skin tag 12/25/2014  . Cutaneous skin tags 08/04/2014  . Hair loss 11/21/2013  . Head injury, unspecified 11/21/2013  . Eye irritation 11/21/2013  . PE (physical exam), annual 10/03/2013  . Morbid obesity 10/03/2013  . Acne 06/26/2011  . URI (upper respiratory infection) 06/26/2011  . Weight gain 05/26/2010  . 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 diclofenac (CATAFLAM) 50 MG tablet Take 1 tablet (50 mg total) by mouth 3 (three) times daily. For dental pain 30 tablet 0  . hydrOXYzine (VISTARIL) 25 MG capsule 25 mg.    . OLANZapine (ZYPREXA) 15 MG tablet Take 15 mg by mouth at bedtime.      .  tretinoin microspheres (RETIN-A MICRO) 0.1 % gel Apply topically at bedtime. 45 g 0   No current facility-administered medications for this visit.    Objective: BP 139/77 mmHg  Pulse 66  Temp(Src) 98.4 F (36.9 C) (Oral)  Ht 5\' 10"  (1.778 m)  Wt 299 lb (135.626 kg)  BMI 42.90 kg/m2 Gen: NAD, alert, cooperative with exam HEENT: NCAT, EOMI, PERRL, cerumen burden in Lt ear, TM clear bilaterally after removal CV: RRR, good S1/S2, no murmur Resp: CTABL, no wheezes, non-labored Abd: Soft, Non Tender, Non Distended, Obese, BS present, no guarding or organomegaly. 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    Assessment/Plan:  Morbid obesity BMI 42.9. We discussed this issue at length. Patient was very receptive and appeared very motivated to help make steps in the right direction for this. We also discussed that this is most likely the cause for his abdominal stretch marks. We discussed exercising for at least daily. Better eating. And additional help.  - exercise regimen discussed and agreed on - healthier eating habits. Discussed increaing veg/fruit consumption, and decreasing starches/fats/juice/pop.  - Patient asked for dietitian consult; consult made and information provided w/ the understanding patient must call to make his own appt. - Labs drawn: CBC, CMP, TSH, Lipid panel, testosterone, HIV  Cerumen impaction Patient reports significant hearing changes over the past year. No history of injury/trauma. On physical exam patient had significant cerumen  burden in Lt ear. This was cleared w/ irrigation by nursing staff. Patient reported resolution/improvement of symptoms after this procedure.  - asked to f/u if symptoms persist/come back.  Hair loss Patient continues to complain of this issue. I encouraged Rogaine use. No signs of tinea on exam. -  Labs drawn: CBC, CMP, TSH, Lipid panel, testosterone, HIV  Vision changes Patient brought this complaint up at the end of our visit. Reports of vision changes were vague. Described as persistent rather than waxing/waning. No association w/ HA/dizziness/confusion. Patient asking for ocular pressure to be taken. Informed him that this was not something I am trained in. - No red flag symptoms at this time. - Encouraged to see optometrist; patient refused and insisted on referral >> Ophtho referral placed    Orders Placed This Encounter  Procedures  . HIV antibody  . Testosterone  . CBC with Differential  . TSH  . Lipid panel  . Comprehensive metabolic panel  . Ambulatory referral to Nutrition & Diabetic Education    Referral Priority:  Routine    Referral Type:  Consultation    Number of Visits Requested:  1  . Ambulatory referral to Ophthalmology    Referral Priority:  Routine    Referral Type:  Consultation    Referral Reason:  Specialty Services Required    Requested Specialty:  Ophthalmology    Number of Visits Requested:  1    Kathee Delton, MD,MS,  PGY2 01/11/2015 9:00 PM

## 2015-01-09 NOTE — Patient Instructions (Signed)
Health Maintenance A healthy lifestyle and preventative care can promote health and wellness. 1. Maintain regular health, dental, and eye exams. 2. Eat a healthy diet. Foods like vegetables, fruits, whole grains, low-fat dairy products, and lean protein foods contain the nutrients you need and are low in calories. Decrease your intake of foods high in solid fats, added sugars, and salt. Get information about a proper diet from your health care provider, if necessary. 3. Regular physical exercise is one of the most important things you can do for your health. Most adults should get at least 150 minutes of moderate-intensity exercise (any activity that increases your heart rate and causes you to sweat) each week. In addition, most adults need muscle-strengthening exercises on 2 or more days a week.  4. Maintain a healthy weight. The body mass index (BMI) is a screening tool to identify possible weight problems. It provides an estimate of body fat based on height and weight. Your health care provider can find your BMI and can help you achieve or maintain a healthy weight. For males 20 years and older: 1. A BMI below 18.5 is considered underweight. 2. A BMI of 18.5 to 24.9 is normal. 3. A BMI of 25 to 29.9 is considered overweight. 4. A BMI of 30 and above is considered obese. 5. Maintain normal blood lipids and cholesterol by exercising and minimizing your intake of saturated fat. Eat a balanced diet with plenty of fruits and vegetables. Blood tests for lipids and cholesterol should begin at age 32 and be repeated every 5 years. If your lipid or cholesterol levels are high, you are over age 35, or you are at high risk for heart disease, you may need your cholesterol levels checked more frequently.Ongoing high lipid and cholesterol levels should be treated with medicines if diet and exercise are not working. 6. If you smoke, find out from your health care provider how to quit. If you do not use tobacco,  do not start. 7. Lung cancer screening is recommended for adults aged 55-80 years who are at high risk for developing lung cancer because of a history of smoking. A yearly low-dose CT scan of the lungs is recommended for people who have at least a 30-pack-year history of smoking and are current smokers or have quit within the past 15 years. A pack year of smoking is smoking an average of 1 pack of cigarettes a day for 1 year (for example, a 30-pack-year history of smoking could mean smoking 1 pack a day for 30 years or 2 packs a day for 15 years). Yearly screening should continue until the smoker has stopped smoking for at least 15 years. Yearly screening should be stopped for people who develop a health problem that would prevent them from having lung cancer treatment. 8. If you choose to drink alcohol, do not have more than 2 drinks per day. One drink is considered to be 12 oz (360 mL) of beer, 5 oz (150 mL) of wine, or 1.5 oz (45 mL) of liquor. 9. Avoid the use of street drugs. Do not share needles with anyone. Ask for help if you need support or instructions about stopping the use of drugs. 10. High blood pressure causes heart disease and increases the risk of stroke. Blood pressure should be checked at least every 1-2 years. Ongoing high blood pressure should be treated with medicines if weight loss and exercise are not effective. 11. If you are 94-12 years old, ask your health care provider if  you should take aspirin to prevent heart disease. 12. Diabetes screening involves taking a blood sample to check your fasting blood sugar level. This should be done once every 3 years after age 18 if you are at a normal weight and without risk factors for diabetes. Testing should be considered at a younger age or be carried out more frequently if you are overweight and have at least 1 risk factor for diabetes. 13. Colorectal cancer can be detected and often prevented. Most routine colorectal cancer screening  begins at the age of 64 and continues through age 18. However, your health care provider may recommend screening at an earlier age if you have risk factors for colon cancer. On a yearly basis, your health care provider may provide home test kits to check for hidden blood in the stool. A small camera at the end of a tube may be used to directly examine the colon (sigmoidoscopy or colonoscopy) to detect the earliest forms of colorectal cancer. Talk to your health care provider about this at age 72 when routine screening begins. A direct exam of the colon should be repeated every 5-10 years through age 12, unless early forms of precancerous polyps or small growths are found. 14. People who are at an increased risk for hepatitis B should be screened for this virus. You are considered at high risk for hepatitis B if: 1. You were born in a country where hepatitis B occurs often. Talk with your health care provider about which countries are considered high risk. 2. Your parents were born in a high-risk country and you have not received a shot to protect against hepatitis B (hepatitis B vaccine). 3. You have HIV or AIDS. 4. You use needles to inject street drugs. 5. You live with, or have sex with, someone who has hepatitis B. 6. You are a man who has sex with other men (MSM). 7. You get hemodialysis treatment. 8. You take certain medicines for conditions like cancer, organ transplantation, and autoimmune conditions. 15. Hepatitis C blood testing is recommended for all people born from [birth year] through 1964 and any individual with known risk factors for hepatitis C. 16. Healthy men should no longer receive prostate-specific antigen (PSA) blood tests as part of routine cancer screening. Talk to your health care provider about prostate cancer screening. 17. Testicular cancer screening is not recommended for adolescents or adult males who have no symptoms. Screening includes self-exam, a health care provider exam, and  other screening tests. Consult with your health care provider about any symptoms you have or any concerns you have about testicular cancer. 18. Practice safe sex. Use condoms and avoid high-risk sexual practices to reduce the spread of sexually transmitted infections (STIs). 19. You should be screened for STIs, including gonorrhea and chlamydia if: 1. You are sexually active and are younger than 24 years. 2. You are older than 24 years, and your health care provider tells you that you are at risk for this type of infection. 3. Your sexual activity has changed since you were last screened, and you are at an increased risk for chlamydia or gonorrhea. Ask your health care provider if you are at risk. 20. If you are at risk of being infected with HIV, it is recommended that you take a prescription medicine daily to prevent HIV infection. This is called pre-exposure prophylaxis (PrEP). You are considered at risk if: 1. You are a man who has sex with other men (MSM). 2. You are a heterosexual man who  is sexually active with multiple partners. 3. You take drugs by injection. 4. You are sexually active with a partner who has HIV. 5. Talk with your health care provider about whether you are at high risk of being infected with HIV. If you choose to begin PrEP, you should first be tested for HIV. You should then be tested every 3 months for as long as you are taking PrEP. 21. Use sunscreen. Apply sunscreen liberally and repeatedly throughout the day. You should seek shade when your shadow is shorter than you. Protect yourself by wearing long sleeves, pants, a wide-brimmed hat, and sunglasses year round whenever you are outdoors. 22. Tell your health care provider of new moles or changes in moles, especially if there is a change in shape or color. Also, tell your health care provider if a mole is larger than the size of a pencil eraser. 23. A one-time screening for abdominal aortic aneurysm (AAA) and surgical  repair of large AAAs by ultrasound is recommended for men aged 65-75 years who are current or former smokers. 24. Stay current with your vaccines (immunizations). Document Released: 10/15/2007 Document Revised: 04/23/2013 Document Reviewed: 09/13/2010 [facility] Patient Information 2015 [place], [place]. This information is not intended to replace advice given to you by your health care provider. Make sure you discuss any questions you have with your health care provider.  Fat and Cholesterol Control Diet Your diet has an affect on your fat and cholesterol levels in your blood and organs. Too much fat and cholesterol in your blood can affect your: 25. Heart. 26. Blood vessels (arteries, veins). 27. Gallbladder. 28. Liver. 29. Pancreas. CONTROL FAT AND CHOLESTEROL WITH DIET Certain foods raise cholesterol and others lower it. It is important to replace bad fats with other types of fat.  Do not eat: 1. Fatty meats, such as hot dogs and salami. 2. Stick margarine and some tub margarines that have "partially hydrogenated oils" in them. 3. Baked goods, such as cookies and crackers that have "partially hydrogenated oils" in them. 4. Saturated tropical oils, such as coconut and palm oil. Eat the following foods: 1. Round or loin cuts of red meat. 2. Chicken (without skin). 3. Fish. 4. Veal. 5. Ground [place] breast. 6. Shellfish. 7. Fruit, such as apples. 8. Vegetables, such as broccoli, potatoes, and carrots. 9. Beans, peas, and lentils (legumes). 10. Grains, such as barley, rice, couscous, and bulgar wheat. 11. Pasta (without cream sauces). Look for foods that are nonfat, low in fat, and low in cholesterol.  FIND FOODS THAT ARE LOWER IN FAT AND CHOLESTEROL 1. Find foods with soluble fiber and plant sterols (phytosterol). You should eat 2 grams a day of these foods. These foods include: 1. Fruits. 2. Vegetables. 3. Whole grains. 4. Dried beans and peas. 5. Nuts and seeds. 2. Read package  labels. Look for low-saturated fats, trans fat free, low-fat foods. 1. Choose cheese that have only 2 to 3 grams of saturated fat per ounce. 2. Use heart-healthy tub margarine that is free of trans fat or partially hydrogenated oil. 3. Avoid buying baked goods that have partially hydrogenated oils in them. Instead, buy baked goods made with whole grains (whole-wheat or whole oat flour). Avoid baked goods labeled with "flour" or "enriched flour." 4. Buy non-creamy canned soups with reduced salt and no added fats. PREPARING YOUR FOOD  Broil, bake, steam, or roast foods. Do not fry food.  Use non-stick cooking sprays.  Use lemon or herbs to flavor food instead of using  butter or stick margarine.  Use nonfat yogurt, salsa, or low-fat dressings for salads. LOW-SATURATED FAT / LOW-FAT FOOD SUBSTITUTES  Meats / Saturated Fat (g)  Avoid: Steak, marbled (3 oz/85 g) / 11 g.  Choose: Steak, lean (3 oz/85 g) / 4 g.  Avoid: Hamburger (3 oz/85 g) / 7 g.  Choose: Hamburger, lean (3 oz/85 g) / 5 g.  Avoid: Ham (3 oz/85 g) / 6 g.  Choose: Ham, lean cut (3 oz/85 g) / 2.4 g.  Avoid: Chicken, with skin, dark meat (3 oz/85 g) / 4 g.  Choose: Chicken, skin removed, dark meat (3 oz/85 g) / 2 g.  Avoid: Chicken, with skin, light meat (3 oz/85 g) / 2.5 g.  Choose: Chicken, skin removed, light meat (3 oz/85 g) / 1 g. Dairy / Saturated Fat (g)  Avoid: Whole milk (1 cup) / 5 g.  Choose: Low-fat milk, 2% (1 cup) / 3 g.  Choose: Low-fat milk, 1% (1 cup) / 1.5 g.  Choose: Skim milk (1 cup) / 0.3 g.  Avoid: Hard cheese (1 oz/28 g) / 6 g.  Choose: Skim milk cheese (1 oz/28 g) / 2 to 3 g.  Avoid: Cottage cheese, 4% fat (1 cup) / 6.5 g.  Choose: Low-fat cottage cheese, 1% fat (1 cup) / 1.5 g.  Avoid: Ice cream (1 cup) / 9 g.  Choose: Sherbet (1 cup) / 2.5 g.  Choose: Nonfat frozen yogurt (1 cup) / 0.3 g.  Choose: Frozen fruit bar / trace.  Avoid: Whipped cream (1 tbs) / 3.5 g.  Choose:  Nondairy whipped topping (1 tbs) / 1 g. Condiments / Saturated Fat (g)  Avoid: Mayonnaise (1 tbs) / 2 g.  Choose: Low-fat mayonnaise (1 tbs) / 1 g.  Avoid: Butter (1 tbs) / 7 g.  Choose: Extra light margarine (1 tbs) / 1 g.  Avoid: Coconut oil (1 tbs) / 11.8 g.  Choose: Olive oil (1 tbs) / 1.8 g.  Choose: Corn oil (1 tbs) / 1.7 g.  Choose: Safflower oil (1 tbs) / 1.2 g.  Choose: Sunflower oil (1 tbs) / 1.4 g.  Choose: Soybean oil (1 tbs) / 2.4 g .  Choose: Canola oil (1 tbs) / 1 g. Document Released: 10/18/2011 Document Revised: 12/19/2012 Document Reviewed: 07/18/2013 [facility] Patient Information 2015 [place], [place]. This information is not intended to replace advice given to you by your health care provider. Make sure you discuss any questions you have with your health care provider.  American Heart Association (AHA) Exercise Recommendation  Being physically active is important to prevent heart disease and stroke, the nation's No. 1and No. 5killers. To improve overall cardiovascular health, we suggest at least 150 minutes per week of moderate exercise or 75 minutes per week of vigorous exercise (or a combination of moderate and vigorous activity). Thirty minutes a day, five times a week is an easy goal to remember. You will also experience benefits even if you divide your time into two or three segments of 10 to 15 minutes per day.  For people who would benefit from lowering their blood pressure or cholesterol, we recommend 40 minutes of aerobic exercise of moderate to vigorous intensity three to four times a week to lower the risk for heart attack and stroke.  Physical activity is anything that makes you move your body and burn calories.  This includes things like climbing stairs or playing sports. Aerobic exercises benefit your heart, and include walking, jogging, swimming or biking. Strength and  stretching exercises are best for overall stamina and flexibility.  The  simplest, positive change you can make to effectively improve your heart health is to start walking. It's enjoyable, free, easy, social and great exercise. A walking program is flexible and boasts high success rates because people can stick with it. It's easy for walking to become a regular and satisfying part of life.   For Overall Cardiovascular Health:  At least 30 minutes of moderate-intensity aerobic activity at least 5 days per week for a total of 150  OR   At least 25 minutes of vigorous aerobic activity at least 3 days per week for a total of 75 minutes; or a combination of moderate- and vigorous-intensity aerobic activity  AND   Moderate- to high-intensity muscle-strengthening activity at least 2 days per week for additional health benefits.  For Lowering Blood Pressure and Cholesterol  An average 40 minutes of moderate- to vigorous-intensity aerobic activity 3 or 4 times per week  What if I can't make it to the time goal? Something is always better than nothing! And everyone has to start somewhere. Even if you've been sedentary for years, today is the day you can begin to make healthy changes in your life. If you don't think you'll make it for 30 or 40 minutes, set a reachable goal for today. You can work up toward your overall goal by increasing your time as you get stronger. Don't let all-or-nothing thinking rob you of doing what you can every day.  Source:http://www.heart.org

## 2015-01-11 DIAGNOSIS — H6120 Impacted cerumen, unspecified ear: Secondary | ICD-10-CM | POA: Insufficient documentation

## 2015-01-11 DIAGNOSIS — H539 Unspecified visual disturbance: Secondary | ICD-10-CM | POA: Insufficient documentation

## 2015-01-11 NOTE — Assessment & Plan Note (Addendum)
Patient brought this complaint up at the end of our visit. Reports of vision changes were vague. Described as persistent rather than waxing/waning. No association w/ HA/dizziness/confusion. Patient asking for ocular pressure to be taken. Informed him that this was not something I am trained in. - No red flag symptoms at this time. - Encouraged to see optometrist; patient refused and insisted on referral >> Ophtho referral placed

## 2015-01-11 NOTE — Assessment & Plan Note (Signed)
Patient reports significant hearing changes over the past year. No history of injury/trauma. On physical exam patient had significant cerumen burden in Lt ear. This was cleared w/ irrigation by nursing staff. Patient reported resolution/improvement of symptoms after this procedure.  - asked to f/u if symptoms persist/come back.

## 2015-01-11 NOTE — Assessment & Plan Note (Signed)
BMI 42.9. We discussed this issue at length. Patient was very receptive and appeared very motivated to help make steps in the right direction for this. We also discussed that this is most likely the cause for his abdominal stretch marks. We discussed exercising for at least daily. Better eating. And additional help.  - exercise regimen discussed and agreed on - healthier eating habits. Discussed increaing veg/fruit consumption, and decreasing starches/fats/juice/pop.  - Patient asked for dietitian consult; consult made and information provided w/ the understanding patient must call to make his own appt. - Labs drawn: CBC, CMP, TSH, Lipid panel, testosterone, HIV

## 2015-01-11 NOTE — Assessment & Plan Note (Signed)
Patient continues to complain of this issue. I encouraged Rogaine use. No signs of tinea on exam. - Labs drawn: CBC, CMP, TSH, Lipid panel, testosterone, HIV

## 2015-01-13 ENCOUNTER — Encounter: Payer: Self-pay | Admitting: Family Medicine

## 2015-04-09 DIAGNOSIS — F4322 Adjustment disorder with anxiety: Secondary | ICD-10-CM | POA: Diagnosis not present

## 2015-04-09 DIAGNOSIS — F209 Schizophrenia, unspecified: Secondary | ICD-10-CM | POA: Diagnosis not present

## 2015-06-30 DIAGNOSIS — F209 Schizophrenia, unspecified: Secondary | ICD-10-CM | POA: Diagnosis not present

## 2015-06-30 DIAGNOSIS — F4322 Adjustment disorder with anxiety: Secondary | ICD-10-CM | POA: Diagnosis not present

## 2015-07-17 DIAGNOSIS — Z79899 Other long term (current) drug therapy: Secondary | ICD-10-CM | POA: Diagnosis not present

## 2015-08-03 DIAGNOSIS — H52223 Regular astigmatism, bilateral: Secondary | ICD-10-CM | POA: Diagnosis not present

## 2015-08-03 DIAGNOSIS — H5211 Myopia, right eye: Secondary | ICD-10-CM | POA: Diagnosis not present

## 2015-08-03 DIAGNOSIS — H04123 Dry eye syndrome of bilateral lacrimal glands: Secondary | ICD-10-CM | POA: Diagnosis not present

## 2015-11-05 ENCOUNTER — Telehealth: Payer: Self-pay | Admitting: Family Medicine

## 2015-11-05 NOTE — Telephone Encounter (Signed)
Called. No answer. These labs were drawn nearly a year ago. No significant abnormalities were found. I can not be 100% certain but the results were likely sent to him around that time.

## 2015-11-05 NOTE — Telephone Encounter (Signed)
Is in [place] now.  Will be coming back next month.  Would like for Dr Wende MottMcKeag to call him. Pt is wanting blood test for hair.  Please advise

## 2015-12-08 ENCOUNTER — Telehealth: Payer: Self-pay | Admitting: Family Medicine

## 2015-12-08 NOTE — Telephone Encounter (Signed)
Pt was informed, but would like the labs mailed to him. Thank you. ep

## 2015-12-08 NOTE — Telephone Encounter (Signed)
Labs mailed per patient request

## 2016-04-21 DIAGNOSIS — M25572 Pain in left ankle and joints of left foot: Secondary | ICD-10-CM | POA: Diagnosis not present

## 2016-04-21 DIAGNOSIS — M722 Plantar fascial fibromatosis: Secondary | ICD-10-CM | POA: Diagnosis not present

## 2016-04-21 DIAGNOSIS — R262 Difficulty in walking, not elsewhere classified: Secondary | ICD-10-CM | POA: Diagnosis not present

## 2016-05-19 DIAGNOSIS — M25572 Pain in left ankle and joints of left foot: Secondary | ICD-10-CM | POA: Diagnosis not present

## 2016-05-19 DIAGNOSIS — M722 Plantar fascial fibromatosis: Secondary | ICD-10-CM | POA: Diagnosis not present

## 2016-05-19 DIAGNOSIS — R262 Difficulty in walking, not elsewhere classified: Secondary | ICD-10-CM | POA: Diagnosis not present

## 2016-09-20 DIAGNOSIS — F250 Schizoaffective disorder, bipolar type: Secondary | ICD-10-CM | POA: Diagnosis not present

## 2016-09-21 DIAGNOSIS — Z79899 Other long term (current) drug therapy: Secondary | ICD-10-CM | POA: Diagnosis not present

## 2016-09-21 DIAGNOSIS — F209 Schizophrenia, unspecified: Secondary | ICD-10-CM | POA: Diagnosis not present

## 2016-09-21 DIAGNOSIS — F250 Schizoaffective disorder, bipolar type: Secondary | ICD-10-CM | POA: Diagnosis not present

## 2016-09-23 ENCOUNTER — Encounter: Payer: Self-pay | Admitting: Family Medicine

## 2016-09-23 ENCOUNTER — Ambulatory Visit (INDEPENDENT_AMBULATORY_CARE_PROVIDER_SITE_OTHER): Payer: Medicare Other | Admitting: Family Medicine

## 2016-09-23 DIAGNOSIS — L918 Other hypertrophic disorders of the skin: Secondary | ICD-10-CM | POA: Diagnosis present

## 2016-09-23 NOTE — Progress Notes (Signed)
   Subjective:   Patient ID: Charles Hernandez    DOB: [birth year], 46 y.o. male   MRN: [id number]  CC: skin tag removal   The patient complains of symptomatic skin tag on the right side of his neck.  Notes that it is bothersome to him because it rubs against his clothing and has become more "dried up".   He states he has had multiple skin tags removed in the past most of which were on his upper back/behind his neck.  Patient otherwise has no complaints this afternoon.     PMFSH: Pertinent past medical, surgical, family, and social history were reviewed and updated as appropriate. Smoking status reviewed.  Medications reviewed.  Objective:   BP 126/80   Pulse (!) 57   Temp 98.1 F (36.7 C) (Oral)   Wt (!) 303 lb (137.4 kg)   SpO2 98%   BMI 43.48 kg/m  Vitals and nursing note reviewed.  General: 46 yo M, well appearing, NAD Neck: supple, nontender, one dark brown 1 mm skin tag on right anterior aspect of neck, no surrounding erythema CV: RRR no MRG  Lungs: CTAB, normal WOB  Skin: warm, dry Extremities: no edema or tenderness noted   Assessment & Plan:   Skin tag Patient with skin tag over right side of neck.  Presents for removal today as it has been bothersome when rubbing against clothing.  Skin tag snipped off using Betadine for cleansing and sterile iris scissors. Local anesthesia was not used. The pathognomonic lesion was not sent for pathology.  Pressure was held following the removal and no bleeding noted.  Bandage was applied.  Follow up care reviewed.     Patient to follow up with PCP on 6/5 as scheduled.   Freddrick MarchYashika Mance [place], MD [facility], PGY-1 09/26/2016 11:14 PM

## 2016-09-26 NOTE — Patient Instructions (Signed)
Skin Tag, Adult A skin tag (acrochordon) is a soft, extra growth of skin. Most skin tags are flesh-colored and rarely bigger than a pencil eraser. They commonly form near areas where there are folds in the skin, such as the armpit or groin. Skin tags are not dangerous, and they do not spread from person to person (are not contagious). You may have one skin tag or several. Skin tags do not require treatment. However, your health care provider may recommend removal of a skin tag if it:  Gets irritated from clothing.  Bleeds.  Is visible and unsightly. Your health care provider can remove skin tags with a simple surgical procedure or a procedure that involves freezing the skin tag. Follow these instructions at home:  Watch for any changes in your skin tag. A normal skin tag does not require any other special care at home.  Take over-the-counter and prescription medicines only as told by your health care provider.  Keep all follow-up visits as told by your health care provider. This is important. Contact a health care provider if:  You have a skin tag that:  Becomes painful.  Changes color.  Bleeds.  Swells.  You develop more skin tags. This information is not intended to replace advice given to you by your health care provider. Make sure you discuss any questions you have with your health care provider. Document Released: 05/03/2015 Document Revised: 12/13/2015 Document Reviewed: 05/03/2015 Elsevier Interactive Patient Education  2017 [employer]  

## 2016-09-26 NOTE — Assessment & Plan Note (Addendum)
Patient with skin tag over right side of neck.  Presents for removal today as it has been bothersome when rubbing against clothing.  Skin tag snipped off using Betadine for cleansing and sterile iris scissors. Local anesthesia was not used. The pathognomonic lesion was not sent for pathology.  Pressure was held following the removal and no bleeding noted.  Bandage was applied.  Follow up care reviewed.

## 2016-10-04 ENCOUNTER — Ambulatory Visit (INDEPENDENT_AMBULATORY_CARE_PROVIDER_SITE_OTHER): Payer: Medicare Other | Admitting: Family Medicine

## 2016-10-04 ENCOUNTER — Encounter: Payer: Self-pay | Admitting: Family Medicine

## 2016-10-04 VITALS — BP 110/80 | HR 56 | Temp 97.6°F | Ht 70.0 in | Wt 303.6 lb

## 2016-10-04 DIAGNOSIS — Z0000 Encounter for general adult medical examination without abnormal findings: Secondary | ICD-10-CM

## 2016-10-04 DIAGNOSIS — E669 Obesity, unspecified: Secondary | ICD-10-CM

## 2016-10-04 DIAGNOSIS — IMO0001 Reserved for inherently not codable concepts without codable children: Secondary | ICD-10-CM

## 2016-10-04 DIAGNOSIS — Z6841 Body Mass Index (BMI) 40.0 and over, adult: Secondary | ICD-10-CM | POA: Diagnosis not present

## 2016-10-04 DIAGNOSIS — J309 Allergic rhinitis, unspecified: Secondary | ICD-10-CM | POA: Insufficient documentation

## 2016-10-04 DIAGNOSIS — J302 Other seasonal allergic rhinitis: Secondary | ICD-10-CM | POA: Diagnosis not present

## 2016-10-04 MED ORDER — CETIRIZINE HCL 10 MG PO TABS: 10.0000 mg | ORAL_TABLET | Freq: Every day | ORAL | 11 refills | Status: DC

## 2016-10-04 NOTE — Patient Instructions (Signed)

## 2016-10-04 NOTE — Progress Notes (Signed)
  Charles HillierIan Amos Micheals, MD, MS Phone: [phone]  Subjective:  CC -- Annual Physical Pt reports he some issues with allergies. Seasonal. A lot of nasal drainage. Sneezing. Worse since coming back to [place] (from [place]).  Cardiovascular: - Dx Hypertension: no  - Dx Hyperlipidemia: no  - Dx Obesity: yes; Class III - Physical Activity: "always walking"; no formal/planned physical activity - Diabetes: no >> but body habitus and weight makes high risk. Will check today.  Cancer: Colorectal >> Colonoscopy: n/a Lung >> Tobacco Use: no  Prostate >> Interested in DRE and/or PSA: n/a  Skin >> Suspicious lesions: no   Social: Alcohol Use: no  Tobacco Use: no  Other Drugs: no  Risky Sexual Behavior: no  Depression: no  Support and Life at Home: yes   Other: Osteoporosis: no  Zoster Vaccine: n/a  Flu Vaccine: not in season Pneumonia Vaccine: n/a  ROS- denies recent fever, chills, headache, blurred vision, shortness of breath, chest pain, nausea, vomiting, diarrhea, dysuria, polydipsia, polyuria, numbness, weakness, or paresthesias.  Past Medical History Patient Active Problem List   Diagnosis Date Noted  . Allergic rhinitis 10/04/2016  . Vision changes 01/11/2015  . Skin tag 12/25/2014  . Hair loss 11/21/2013  . Head injury, unspecified 11/21/2013  . PE (physical exam), annual 10/03/2013  . Morbid obesity (HCC) 10/03/2013  . Acne 06/26/2011  . Weight gain 05/26/2010  . 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 cetirizine (ZYRTEC) 10 MG tablet Take 1 tablet (10 mg total) by mouth daily. 30 tablet 11  . hydrOXYzine (VISTARIL) 25 MG capsule 25 mg.    . OLANZapine (ZYPREXA) 15 MG tablet Take 15 mg by mouth at bedtime.      . tretinoin microspheres (RETIN-A MICRO) 0.1 % gel Apply topically at bedtime. 45 g 0   No current facility-administered medications for this visit.     Objective: BP 110/80    Pulse (!) 56   Temp 97.6 F (36.4 C) (Oral)   Ht 5\' 10"  (1.778 m)   Wt (!) 303 lb 9.6 oz (137.7 kg)   SpO2 94%   BMI 43.56 kg/m  Gen: NAD, alert, cooperative with exam HEENT: NCAT, EOMI, PERRL CV: RRR, good S1/S2, no murmur Resp: CTABL, no wheezes, non-labored Abd: Soft, Non Tender, Non Distended, BS present, no guarding or organomegaly Ext: No edema, warm, peripheral pulses intact bilaterally, +1 DTRs bilaterally Neuro: Alert and oriented, No gross deficits, speech clear.   Assessment/Plan:  PE (physical exam), annual Patient is here for annual physical. He states that he feels well overall however has had some issues with "allergies". - Physical exam unremarkable. - Patient is severely obese and likely at risk for type 2 diabetes. - Obtaining BMP.  Allergic rhinitis Patient is complaining of signs and symptoms consistent with seasonal allergies/allergic rhinitis. - 10 mg Zyrtec daily   Orders Placed This Encounter  Procedures  . Basic Metabolic Panel    Meds ordered this encounter  Medications  . cetirizine (ZYRTEC) 10 MG tablet    Sig: Take 1 tablet (10 mg total) by mouth daily.    Dispense:  30 tablet    Refill:  11     Kathee DeltonIan D Rakel Junio, MD,MS,  PGY3 10/04/2016 3:56 PM

## 2016-10-04 NOTE — Assessment & Plan Note (Signed)
Patient is complaining of signs and symptoms consistent with seasonal allergies/allergic rhinitis. - 10 mg Zyrtec daily

## 2016-10-04 NOTE — Assessment & Plan Note (Signed)
Patient is here for annual physical. He states that he feels well overall however has had some issues with "allergies". - Physical exam unremarkable. - Patient is severely obese and likely at risk for type 2 diabetes. - Obtaining BMP.

## 2016-10-05 DIAGNOSIS — F250 Schizoaffective disorder, bipolar type: Secondary | ICD-10-CM | POA: Diagnosis not present

## 2016-10-05 LAB — SPECIMEN STATUS

## 2016-10-05 LAB — BASIC METABOLIC PANEL
BUN/Creatinine Ratio: 14 (ref 9–20)
BUN: 13 mg/dL (ref 6–24)
CHLORIDE: 100 mmol/L (ref 96–106)
CO2: 23 mmol/L (ref 18–29)
Calcium: 9.4 mg/dL (ref 8.7–10.2)
Creatinine, Ser: 0.93 mg/dL (ref 0.76–1.27)
GFR calc Af Amer: 115 mL/min/{1.73_m2} (ref >59)
GFR calc non Af Amer: 99 mL/min/{1.73_m2} (ref >59)
Glucose: 70 mg/dL (ref 65–99)
POTASSIUM: 4.2 mmol/L (ref 3.5–5.2)
SODIUM: 139 mmol/L (ref 134–144)

## 2016-11-01 ENCOUNTER — Ambulatory Visit: Payer: Medicare Other | Admitting: Family Medicine

## 2016-11-03 ENCOUNTER — Encounter: Payer: Self-pay | Admitting: Family Medicine

## 2016-11-03 ENCOUNTER — Ambulatory Visit (INDEPENDENT_AMBULATORY_CARE_PROVIDER_SITE_OTHER): Payer: Medicare Other | Admitting: Family Medicine

## 2016-11-03 DIAGNOSIS — M722 Plantar fascial fibromatosis: Secondary | ICD-10-CM

## 2016-11-03 NOTE — Patient Instructions (Signed)
You have plantar fasciitis Take tylenol or aleve as needed for pain  Plantar fascia stretch for 20-30 seconds (do 3 of these) in morning Lowering/raise on a step exercises 3 x 10 once or twice a day - this is very important for long term recovery. Can add heel walks, toe walks forward and backward as well Ice heel for 15 minutes as needed. Avoid flat shoes/barefoot walking as much as possible. Arch straps have been shown to help with pain. Inserts with arch support (our green insoles, spencos, dr. Jari Sportsmanscholls active series, or something similar) when up and walking around. Steroid injection is a consideration for short term pain relief if you are struggling. Physical therapy is also an option. Follow up with me in 1 month for reevaluation.

## 2016-11-08 DIAGNOSIS — M722 Plantar fascial fibromatosis: Secondary | ICD-10-CM | POA: Insufficient documentation

## 2016-11-08 NOTE — Progress Notes (Signed)
PCP and consultation requested by: Renne MuscaWarden, Daniel L, MD  Subjective:   HPI: Patient is a 46 y.o. male here for left heel pain.  Patient reports he had problems with left heel back in October 2017. Diagnosed with plantar fasciitis - used a sleeve and injection and improved. Then a couple weeks ago pain started back. Has been soaking the foot. No new injuries or trauma. Pain worse with walking, up to 5/10 and sharp, plantar. Has improved some over past couple days. No skin changes, numbness.  Past Medical History:  Diagnosis Date  . Mental disability   . Schizophrenia (HCC) 1993    Current Outpatient Prescriptions on File Prior to Visit  Medication Sig Dispense Refill  . hydrOXYzine (VISTARIL) 25 MG capsule 25 mg.    . OLANZapine (ZYPREXA) 15 MG tablet Take 15 mg by mouth at bedtime.      . tretinoin microspheres (RETIN-A MICRO) 0.1 % gel Apply topically at bedtime. 45 g 0   No current facility-administered medications on file prior to visit.     No past surgical history on file.  No Known Allergies  Social History   Social History  . Marital status: Single    Spouse name: N/A  . Number of children: N/A  . Years of education: N/A   Occupational History  . Not on file.   Social History Main Topics  . Smoking status: Never Smoker  . Smokeless tobacco: Never Used  . Alcohol use No  . Drug use: No  . Sexual activity: Not Currently   Other Topics Concern  . Not on file   Social History Narrative   Lives in [place].  Family lives in [place].  He is on disability for mental illness.    Family History  Problem Relation Age of Onset  . Cancer Mother   . Cancer Sister     BP 120/81   Pulse 65   Ht 5\' 10"  (1.778 m)   Wt 288 lb (130.6 kg)   BMI 41.32 kg/m   Review of Systems: See HPI above.     Objective:  Physical Exam:  Gen: NAD, comfortable in exam room  Left foot/ankle: Pes planus.  No other gross deformity, swelling, ecchymoses FROM TTP  medial plantar calcaneus at plantar fascia insertion. Negative ant drawer and talar tilt.   Negative syndesmotic compression. Negative calcaneal squeeze. Thompsons test negative. NV intact distally.  Right foot/ankle: FROM without pain.   Assessment & Plan:  1. Left plantar fasciitis - shown home exercises and stretches to do daily.  Icing, tylenol or aleve if needed.  Arch binders.  Sports insoles provided as well for arch support.  Consider repeat injection, physical therapy if not improving.  F/u in 1 month.

## 2016-11-08 NOTE — Assessment & Plan Note (Signed)
shown home exercises and stretches to do daily.  Icing, tylenol or aleve if needed.  Arch binders.  Sports insoles provided as well for arch support.  Consider repeat injection, physical therapy if not improving.  F/u in 1 month.

## 2016-11-09 ENCOUNTER — Telehealth: Payer: Self-pay | Admitting: Family Medicine

## 2016-11-09 NOTE — Telephone Encounter (Signed)
Absolutely!  Any time tomorrow we will fix it.  Thanks!

## 2016-11-09 NOTE — Telephone Encounter (Signed)
Patient called stating the right insole that was fitted at last OV is not secured. States it has slid up and there is now a space in the back. Patient wants to know he can come in sometime tomorrow, 7/12, and have this fixed.   Patient is going out of town this weekend

## 2016-11-11 NOTE — Telephone Encounter (Signed)
Patient came by on 11-10-16. Fixed insoles.

## 2016-11-30 DIAGNOSIS — F250 Schizoaffective disorder, bipolar type: Secondary | ICD-10-CM | POA: Diagnosis not present

## 2016-12-05 ENCOUNTER — Ambulatory Visit: Payer: Medicare Other | Admitting: Family Medicine

## 2016-12-06 ENCOUNTER — Ambulatory Visit (INDEPENDENT_AMBULATORY_CARE_PROVIDER_SITE_OTHER): Payer: Medicare Other | Admitting: Family Medicine

## 2016-12-06 ENCOUNTER — Encounter: Payer: Self-pay | Admitting: Family Medicine

## 2016-12-06 DIAGNOSIS — M722 Plantar fascial fibromatosis: Secondary | ICD-10-CM | POA: Diagnosis present

## 2016-12-06 NOTE — Patient Instructions (Signed)
Continue home exercises, arch binders until pain has resolved. Continue with good arch support indefinitely. Follow up as needed.

## 2016-12-07 NOTE — Assessment & Plan Note (Signed)
improving with home exercises, arch binders, arch supports.  Was shown how to order sports insoles and replace every 6 months.  Aleve or ibuprofen only if needed.  Icing if needed.  F/u prn.

## 2016-12-07 NOTE — Progress Notes (Signed)
PCP and consultation requested by: Renne MuscaWarden, Daniel L, MD  Subjective:   HPI: Patient is a 46 y.o. male here for left heel pain.  7/5: Patient reports he had problems with left heel back in October 2017. Diagnosed with plantar fasciitis - used a sleeve and injection and improved. Then a couple weeks ago pain started back. Has been soaking the foot. No new injuries or trauma. Pain worse with walking, up to 5/10 and sharp, plantar. Has improved some over past couple days. No skin changes, numbness.  8/7: Patient reports he's doing well. Pain down to 2-3/10 and less sharp plantar heel. Has been soaking in the shower, using arch binders and sports insoles. Worse with prolonged immobility. Doing home exercise program regularly. No skin changes, numbness.  Past Medical History:  Diagnosis Date  . Mental disability   . Schizophrenia (HCC) 1993    Current Outpatient Prescriptions on File Prior to Visit  Medication Sig Dispense Refill  . busPIRone (BUSPAR) 5 MG tablet Take 5 mg by mouth 3 (three) times daily.  1  . hydrOXYzine (VISTARIL) 25 MG capsule 25 mg.    . levocetirizine (XYZAL) 5 MG tablet     . OLANZapine (ZYPREXA) 15 MG tablet Take 15 mg by mouth at bedtime.      Marland Kitchen. perphenazine (TRILAFON) 4 MG tablet Take 4 mg by mouth 3 (three) times daily.  1  . tretinoin microspheres (RETIN-A MICRO) 0.1 % gel Apply topically at bedtime. 45 g 0   No current facility-administered medications on file prior to visit.     No past surgical history on file.  No Known Allergies  Social History   Social History  . Marital status: Single    Spouse name: N/A  . Number of children: N/A  . Years of education: N/A   Occupational History  . Not on file.   Social History Main Topics  . Smoking status: Never Smoker  . Smokeless tobacco: Never Used  . Alcohol use No  . Drug use: No  . Sexual activity: Not Currently   Other Topics Concern  . Not on file   Social History Narrative   Lives in [place].  Family lives in [place].  He is on disability for mental illness.    Family History  Problem Relation Age of Onset  . Cancer Mother   . Cancer Sister     BP 126/87   Pulse 60   Ht 5\' 10"  (1.778 m)   Wt 284 lb (128.8 kg)   BMI 40.75 kg/m   Review of Systems: See HPI above.     Objective:  Physical Exam:  Gen: NAD, comfortable in exam room  Left foot/ankle: Pes planus.  No other gross deformity, swelling, ecchymoses FROM Minimal TTP medial plantar calcaneus at plantar fascia insertion. Negative ant drawer and talar tilt.   Negative calcaneal squeeze. Thompsons test negative. NV intact distally.  Right foot/ankle: FROM without pain.   Assessment & Plan:  1. Left plantar fasciitis - improving with home exercises, arch binders, arch supports.  Was shown how to order sports insoles and replace every 6 months.  Aleve or ibuprofen only if needed.  Icing if needed.  F/u prn.

## 2016-12-08 DIAGNOSIS — F250 Schizoaffective disorder, bipolar type: Secondary | ICD-10-CM | POA: Diagnosis not present

## 2016-12-09 ENCOUNTER — Ambulatory Visit (INDEPENDENT_AMBULATORY_CARE_PROVIDER_SITE_OTHER): Payer: Medicare Other | Admitting: Family Medicine

## 2016-12-09 ENCOUNTER — Encounter: Payer: Self-pay | Admitting: Family Medicine

## 2016-12-09 VITALS — BP 128/70 | HR 69 | Temp 98.5°F | Ht 70.0 in | Wt 305.0 lb

## 2016-12-09 DIAGNOSIS — L0291 Cutaneous abscess, unspecified: Secondary | ICD-10-CM | POA: Diagnosis not present

## 2016-12-09 NOTE — Assessment & Plan Note (Addendum)
Simple cutaneous abscess. s/p I&D with local lidocaine 2% w/o epi. Purlent drainged. Skin preped w/ iodione. Covered with topical bactrum. Strict return precautions given.

## 2016-12-09 NOTE — Progress Notes (Addendum)
    Subjective:  Charles SimpsonYassin Hernandez is a 46 y.o. male who [facility] with a chief complaint of painful, itching bump on chest.   HPI: Noticed it 2 day go, getting increasing itchy and painful. Located in midline at base of sternum. Never had anything like this before. Pt in concerned that it may be an insect bite. Pt has not been outside w/o shirt. Pt has ants in house. Pt has a shirt at home w/ hole in it. Was wearing it all yesterday. Denies fevers, chills. Worse when exposed to water. Pt is taking ibuprofen for pain. Pt is endorsing right arm pain, but has been experiencing it since has been sleeping on his side on his couch.   ROS: Per HPI   Objective:  Physical Exam: BP 128/70   Pulse 69   Temp 98.5 F (36.9 C) (Oral)   Ht 5\' 10"  (1.778 m)   Wt (!) 305 lb (138.3 kg)   SpO2 98%   BMI 43.76 kg/m   Skin: fluctuant, erythematous, tender nodule approximately 2 cm located on skin overlying pt xyphoid   No results found for this or any previous visit (from the past 72 hour(s)).   Assessment/Plan:  Abscess Simple cutaneous abscess. s/p I&D with local lidocaine 2% w/o epi. Purlent drainged. Skin preped w/ iodione. Covered with topical bactrum. Strict return precautions given.    Charles DinningBrad Ailish Prospero, MD, MS FAMILY MEDICINE RESIDENT - PGY1 12/09/2016 4:32 PM

## 2016-12-09 NOTE — Patient Instructions (Signed)
It was a pleasure to see you today! Thank you for choosing [facility] for your primary care. Charles SimpsonYassin Hernandez was seen for skin abscess. We performed an incision and drainage. Keep area clean and dry. Cover with adhesive bandage. Come back to the clinic if site becomes increasingly painful, red, or if you develop a fever.    Incision and Drainage, Care After Refer to this sheet in the next few weeks. These instructions provide you with information about caring for yourself after your procedure. Your health care provider may also give you more specific instructions. Your treatment has been planned according to current medical practices, but problems sometimes occur. Call your health care provider if you have any problems or questions after your procedure. What can I expect after the procedure? After the procedure, it is common to have:  Pain or discomfort around your incision site.  Drainage from your incision.  Follow these instructions at home:  Take over-the-counter and prescription medicines only as told by your health care provider.  If you were prescribed an antibiotic medicine, take it as told by your health care provider.Do not stop taking the antibiotic even if you start to feel better.  Followinstructions from your health care provider about: ? How to take care of your incision. ? When and how you should change your packing and bandage (dressing). Wash your hands with soap and water before you change your dressing. If soap and water are not available, use hand sanitizer. ? When you should remove your dressing.  Do not take baths, swim, or use a hot tub until your health care provider approves.  Keep all follow-up visits as told by your health care provider. This is important.  Check your incision area every day for signs of infection. Check for: ? More redness, swelling, or pain. ? More fluid or blood. ? Warmth. ? Pus or a bad smell. Contact a health care provider  if:  Your cyst or abscess returns.  You have a fever.  You have more redness, swelling, or pain around your incision.  You have more fluid or blood coming from your incision.  Your incision feels warm to the touch.  You have pus or a bad smell coming from your incision. Get help right away if:  You have severe pain or bleeding.  You cannot eat or drink without vomiting.  You have decreased urine output.  You become short of breath.  You have chest pain.  You cough up blood.  The area where the incision and drainage occurred becomes numb or it tingles. This information is not intended to replace advice given to you by your health care provider. Make sure you discuss any questions you have with your health care provider. Document Released: 07/11/2011 Document Revised: 09/18/2015 Document Reviewed: 02/06/2015 Elsevier Interactive Patient Education  2017 [employer]  Best,  Thomes DinningBrad Marlayna Bannister, MD, MS FAMILY MEDICINE RESIDENT - PGY1 12/09/2016 4:26 PM

## 2016-12-29 ENCOUNTER — Telehealth: Payer: Self-pay | Admitting: *Deleted

## 2016-12-29 NOTE — Telephone Encounter (Signed)
Was not me. Dr. Janee Mornhompson did the the patient 2 weeks ago perhaps it was him?  Will forward message.   Randalyn Ahmed L. Myrtie SomanWarden, MD [facility] Resident PGY-2 12/29/2016 10:09 AM

## 2016-12-29 NOTE — Telephone Encounter (Signed)
Patient's mother called stating someone called them yesterday and return call. There was no phone message in patient's chart that someone called.  Clovis PuMartin, Tamika L, RN

## 2017-02-13 ENCOUNTER — Encounter: Payer: Self-pay | Admitting: Internal Medicine

## 2017-02-13 ENCOUNTER — Ambulatory Visit (INDEPENDENT_AMBULATORY_CARE_PROVIDER_SITE_OTHER): Payer: Medicare Other | Admitting: Internal Medicine

## 2017-02-13 DIAGNOSIS — Z23 Encounter for immunization: Secondary | ICD-10-CM | POA: Diagnosis present

## 2017-02-13 DIAGNOSIS — M79622 Pain in left upper arm: Secondary | ICD-10-CM

## 2017-02-13 NOTE — Assessment & Plan Note (Signed)
Most consistent with MSK etiology. Biceps tendon rupture less likely, as no abnormalities seen on inspection, no TTP, and full strength of LUE. No weakness or reported symptoms consistent with neuro involvement. Likely 2/2 overuse with increased gym activities or possibly overexertion at gym. Will treat conservatively for now with NSAIDs and activity modification. If no improvement in about a month, will refer to sports med.

## 2017-02-13 NOTE — Patient Instructions (Addendum)
It was nice meeting you today Charles Hernandez!  For your arm pain, you can take two regular strength ibuprofen tablets every 4-6 hours as needed for pain. Try to decrease the amount of weight you are lifting for now until the pain improves. You can continue to put heat or ice on your arm if it improves your pain. You can also put [facility] on the area to see if that helps as well.   If you are still having pain after about a month of decreased activity and ibuprofen, please let us know and we can consider a referral to our sports medicine clinic.   If you have any questions or concerns, please feel free to call the clinic.   Be well,  Dr. Natale Milch

## 2017-02-13 NOTE — Progress Notes (Signed)
   Subjective:   Patient: Charles Hernandez       Birthdate: [birth year]       MRN: [id number]      HPI  Issiah Huffaker is a 46 y.o. male presenting for same day appointment for L upper arm pain.   L upper arm pain Began about a year ago, but has acutely worsened in past two weeks. Began when patient was in [place] shoveling snow last winter. There was a bad snow storm and he had to shovel snow for multiple days in a row, which he does not typically do. Pain had not been very bad after that until about two weeks ago when he started going to the gym more regularly. Has been able to do all of his usual gym exercises, including lifting barbells and dumbbells, however says that he now has pain after performing these exercises, and feels a "strain" pulling across the top of his L upper arm. Has been taking Tylenol and ASA with no improvement in pain. Has also been putting ice on the affected area, massaging it, and sitting in the steam room at the gym as well as taking long hot shower. This does not improve his pain very much. Denies redness, swelling, or bruising at site of pain. Denies weakness of affected arm or hand, and is still able to lift the same weights as he was previously. Denies numbness or tingling of the affected arm or hand. Pain most prominent after bench press.   Smoking status reviewed. Patient is never smoker.   Review of Systems See HPI.     Objective:  Physical Exam  Constitutional: He is oriented to person, place, and time.  Obese male in NAD  HENT:  Head: Normocephalic and atraumatic.  Eyes: Conjunctivae and EOM are normal. Right eye exhibits no discharge. Left eye exhibits no discharge.  Pulmonary/Chest: Effort normal. No respiratory distress.  Musculoskeletal:  Exam limited by patient's body habitus. No obvious abnormalities on inspection. No TTP of L upper arm. No skin changes, including erythema or bruising. No swelling noted. 5/5 strength upper extremities bilaterally. Full grip  strength bilaterally. Full ROM upper extremities bilaterally.   Neurological: He is alert and oriented to person, place, and time.  Skin: Skin is warm and dry.  Psychiatric: Affect and judgment normal.      Assessment & Plan:  Left upper arm pain Most consistent with MSK etiology. Biceps tendon rupture less likely, as no abnormalities seen on inspection, no TTP, and full strength of LUE. No weakness or reported symptoms consistent with neuro involvement. Likely 2/2 overuse with increased gym activities or possibly overexertion at gym. Will treat conservatively for now with NSAIDs and activity modification. If no improvement in about a month, will refer to sports med.    Tarri Abernethy, MD, MPH PGY-3 [facility] Pager [phone]

## 2017-02-22 DIAGNOSIS — F250 Schizoaffective disorder, bipolar type: Secondary | ICD-10-CM | POA: Diagnosis not present

## 2017-03-22 DIAGNOSIS — F250 Schizoaffective disorder, bipolar type: Secondary | ICD-10-CM | POA: Diagnosis not present

## 2017-09-19 DIAGNOSIS — F250 Schizoaffective disorder, bipolar type: Secondary | ICD-10-CM | POA: Diagnosis not present

## 2017-10-20 ENCOUNTER — Other Ambulatory Visit: Payer: Self-pay | Admitting: *Deleted

## 2017-10-20 MED ORDER — LEVOCETIRIZINE DIHYDROCHLORIDE 5 MG PO TABS: 5.0000 mg | ORAL_TABLET | Freq: Every day | ORAL | 11 refills | Status: DC

## 2017-10-24 ENCOUNTER — Other Ambulatory Visit: Payer: Self-pay

## 2017-10-24 ENCOUNTER — Encounter: Payer: Self-pay | Admitting: Family Medicine

## 2017-10-24 ENCOUNTER — Ambulatory Visit (INDEPENDENT_AMBULATORY_CARE_PROVIDER_SITE_OTHER): Payer: Medicare Other | Admitting: Family Medicine

## 2017-10-24 VITALS — BP 118/80 | HR 53 | Temp 97.9°F | Wt 283.0 lb

## 2017-10-24 DIAGNOSIS — Z0000 Encounter for general adult medical examination without abnormal findings: Secondary | ICD-10-CM

## 2017-10-24 NOTE — Patient Instructions (Signed)
It was very nice to meet you today!   Keep up the great work with weight loss. You're on track to reach your goals!  We'll check labs today and we will send you a letter with the results.  If you have questions or concerns please do not hesitate to call at [phone].  Dolores PattyAngela Enis Leatherwood, DO PGY-2, [facility] 10/24/2017 8:57 AM   Health Maintenance, Male A healthy lifestyle and preventive care is important for your health and wellness. Ask your health care provider about what schedule of regular examinations is right for you. What should I know about weight and diet? Eat a Healthy Diet  Eat plenty of vegetables, fruits, whole grains, low-fat dairy products, and lean protein.  Do not eat a lot of foods high in solid fats, added sugars, or salt.  Maintain a Healthy Weight Regular exercise can help you achieve or maintain a healthy weight. You should:  Do at least 150 minutes of exercise each week. The exercise should increase your heart rate and make you sweat (moderate-intensity exercise).  Do strength-training exercises at least twice a week.  Watch Your Levels of Cholesterol and Blood Lipids  Have your blood tested for lipids and cholesterol every 5 years starting at 46 years of age. If you are at high risk for heart disease, you should start having your blood tested when you are 46 years old. You may need to have your cholesterol levels checked more often if: ? Your lipid or cholesterol levels are high. ? You are older than 46 years of age. ? You are at high risk for heart disease.  Colorectal Cancer  Routine colorectal cancer screening usually begins at 46 years of age and should be repeated every 5-10 years until you are 46 years old. You may need to be screened more often if early forms of precancerous polyps or small growths are found. Your health care provider may recommend screening at an earlier age if you have risk factors for colon cancer.  Your  health care provider may recommend using home test kits to check for hidden blood in the stool.  A small camera at the end of a tube can be used to examine your colon (sigmoidoscopy or colonoscopy). This checks for the earliest forms of colorectal cancer.  Prostate and Testicular Cancer  Depending on your age and overall health, your health care provider may do certain tests to screen for prostate and testicular cancer.  Talk to your health care provider about any symptoms or concerns you have about testicular or prostate cancer.  Skin Cancer  Check your skin from head to toe regularly.  Tell your health care provider about any new moles or changes in moles, especially if: ? There is a change in a mole's size, shape, or color. ? You have a mole that is larger than a pencil eraser.  Always use sunscreen. Apply sunscreen liberally and repeat throughout the day.  Protect yourself by wearing long sleeves, pants, a wide-brimmed hat, and sunglasses when outside.  What should I know about heart disease, diabetes, and high blood pressure?  If you are 4318-46 years of age, have your blood pressure checked every 3-5 years. If you are 46 years of age or older, have your blood pressure checked every year. You should have your blood pressure measured twice-once when you are at a hospital or clinic, and once when you are not at a hospital or clinic. Record the average of the two measurements. To  check your blood pressure when you are not at a hospital or clinic, you can use: ? An automated blood pressure machine at a pharmacy. ? A home blood pressure monitor.  Talk to your health care provider about your target blood pressure.  If you are between 78-69 years old, ask your health care provider if you should take aspirin to prevent heart disease.  Have regular diabetes screenings by checking your fasting blood sugar level. ? If you are at a normal weight and have a low risk for diabetes, have this  test once every three years after the age of 4. ? If you are overweight and have a high risk for diabetes, consider being tested at a younger age or more often.  A one-time screening for abdominal aortic aneurysm (AAA) by ultrasound is recommended for men aged 65-75 years who are current or former smokers. What should I know about preventing infection? Hepatitis B If you have a higher risk for hepatitis B, you should be screened for this virus. Talk with your health care provider to find out if you are at risk for hepatitis B infection. Hepatitis C Blood testing is recommended for:  Everyone born from [birth year] through 1964.  Anyone with known risk factors for hepatitis C.  Sexually Transmitted Diseases (STDs)  You should be screened each year for STDs including gonorrhea and chlamydia if: ? You are sexually active and are younger than 46 years of age. ? You are older than 46 years of age and your health care provider tells you that you are at risk for this type of infection. ? Your sexual activity has changed since you were last screened and you are at an increased risk for chlamydia or gonorrhea. Ask your health care provider if you are at risk.  Talk with your health care provider about whether you are at high risk of being infected with HIV. Your health care provider may recommend a prescription medicine to help prevent HIV infection.  What else can I do?  Schedule regular health, dental, and eye exams.  Stay current with your vaccines (immunizations).  Do not use any tobacco products, such as cigarettes, chewing tobacco, and e-cigarettes. If you need help quitting, ask your health care provider.  Limit alcohol intake to no more than 2 drinks per day. One drink equals 12 ounces of beer, 5 ounces of wine, or 1 ounces of hard liquor.  Do not use street drugs.  Do not share needles.  Ask your health care provider for help if you need support or information about quitting  drugs.  Tell your health care provider if you often feel depressed.  Tell your health care provider if you have ever been abused or do not feel safe at home. This information is not intended to replace advice given to you by your health care provider. Make sure you discuss any questions you have with your health care provider. Document Released: 10/15/2007 Document Revised: 12/16/2015 Document Reviewed: 01/20/2015 Elsevier Interactive Patient Education  [employer].

## 2017-10-24 NOTE — Progress Notes (Signed)
    Subjective:    Patient ID: Charles Hernandez, male    DOB: Nov 05, 1970, 47 y.o.   MRN: [id number]   CC: physical exam  Working on losing weight. Weight today down 18 pounds from October. He goes to the gym "every chance he gets" and mostly does cardio. He does aquatic classes as well. He is very motivated to lose weight. He denies any SOB or chest pain with exertion. He is eating well. Normal bowel movements. No urinary complaints.   PMH- schizophrenia, allergies, morbid obesity Meds- trilafon, xyzal Allergies- seasonal Surg- none FH- mom in good health, HTN. Father age 46 has osteoporosis. 6 siblings in good health.  SH- denies alcohol drug tobacco use, lives with mom Smoking status reviewed- never smoker  Review of Systems- see HPI   Objective:  BP 118/80   Pulse (!) 53   Temp 97.9 F (36.6 C) (Oral)   Wt 283 lb (128.4 kg)   SpO2 96%   BMI 40.61 kg/m  Vitals and nursing note reviewed  General: pleasant AA male, well nourished, in no acute distress HEENT: normocephalic, no scleral icterus or conjunctival pallor, no nasal discharge, moist mucous membranes, good dentition without erythema or discharge noted in posterior oropharynx Neck: supple, non-tender, without lymphadenopathy Cardiac: RRR, clear S1 and S2, no murmurs, rubs, or gallops Respiratory: clear to auscultation bilaterally, no increased work of breathing Abdomen: obese abdomen. Stretch marks present. Soft, nontender, nondistended, no masses or organomegaly. Bowel sounds present Extremities: no edema or cyanosis Skin: warm and dry, no rashes noted Neuro: alert and oriented, no focal deficits. Normal gait. Strength 5/5 in upper and lower extremities bilaterally    Assessment & Plan:   1. PE (physical exam), annual Overall 47 year old in good health, working on weight loss. No concerns at this time.  - Lipid panel - CBC - Basic Metabolic Panel  2. Morbid obesity (HCC) Will check lipids, blood count, and BMP.  Encouraged weight loss. - Lipid panel - CBC - Basic Metabolic Panel   Return in about 1 year (around 10/25/2018), or as needed.   Dolores PattyAngela Tima Curet, DO Family Medicine Resident PGY-2

## 2017-10-25 LAB — CBC
HEMOGLOBIN: 15 g/dL (ref 13.0–17.7)
Hematocrit: 43.9 % (ref 37.5–51.0)
MCH: 29 pg (ref 26.6–33.0)
MCHC: 34.2 g/dL (ref 31.5–35.7)
MCV: 85 fL (ref 79–97)
PLATELETS: 255 10*3/uL (ref 150–450)
RBC: 5.17 x10E6/uL (ref 4.14–5.80)
RDW: 15.2 % (ref 12.3–15.4)
WBC: 4.4 10*3/uL (ref 3.4–10.8)

## 2017-10-25 LAB — LIPID PANEL
CHOL/HDL RATIO: 3.8 ratio (ref 0.0–5.0)
Cholesterol, Total: 176 mg/dL (ref 100–199)
HDL: 46 mg/dL (ref >39)
LDL CALC: 116 mg/dL — AB (ref 0–99)
Triglycerides: 72 mg/dL (ref 0–149)
VLDL CHOLESTEROL CAL: 14 mg/dL (ref 5–40)

## 2017-10-25 LAB — BASIC METABOLIC PANEL
BUN / CREAT RATIO: 11 (ref 9–20)
BUN: 12 mg/dL (ref 6–24)
CHLORIDE: 104 mmol/L (ref 96–106)
CO2: 24 mmol/L (ref 20–29)
Calcium: 8.3 mg/dL — ABNORMAL LOW (ref 8.7–10.2)
Creatinine, Ser: 1.07 mg/dL (ref 0.76–1.27)
GFR calc Af Amer: 96 mL/min/{1.73_m2} (ref >59)
GFR calc non Af Amer: 83 mL/min/{1.73_m2} (ref >59)
GLUCOSE: 84 mg/dL (ref 65–99)
Potassium: 4.7 mmol/L (ref 3.5–5.2)
SODIUM: 141 mmol/L (ref 134–144)

## 2017-10-26 ENCOUNTER — Encounter: Payer: Self-pay | Admitting: Family Medicine

## 2018-04-05 DIAGNOSIS — F419 Anxiety disorder, unspecified: Secondary | ICD-10-CM | POA: Diagnosis not present

## 2018-04-05 DIAGNOSIS — F250 Schizoaffective disorder, bipolar type: Secondary | ICD-10-CM | POA: Diagnosis not present

## 2018-04-10 DIAGNOSIS — F250 Schizoaffective disorder, bipolar type: Secondary | ICD-10-CM | POA: Diagnosis not present

## 2018-04-10 DIAGNOSIS — F419 Anxiety disorder, unspecified: Secondary | ICD-10-CM | POA: Diagnosis not present

## 2018-05-27 DIAGNOSIS — Z23 Encounter for immunization: Secondary | ICD-10-CM | POA: Diagnosis not present

## 2018-07-10 DIAGNOSIS — F250 Schizoaffective disorder, bipolar type: Secondary | ICD-10-CM | POA: Diagnosis not present

## 2018-08-03 DIAGNOSIS — F250 Schizoaffective disorder, bipolar type: Secondary | ICD-10-CM | POA: Diagnosis not present

## 2018-08-21 DIAGNOSIS — F250 Schizoaffective disorder, bipolar type: Secondary | ICD-10-CM | POA: Diagnosis not present

## 2018-09-05 DIAGNOSIS — F419 Anxiety disorder, unspecified: Secondary | ICD-10-CM | POA: Diagnosis not present

## 2018-09-05 DIAGNOSIS — F250 Schizoaffective disorder, bipolar type: Secondary | ICD-10-CM | POA: Diagnosis not present

## 2018-09-11 DIAGNOSIS — F419 Anxiety disorder, unspecified: Secondary | ICD-10-CM | POA: Diagnosis not present

## 2018-10-02 DIAGNOSIS — F419 Anxiety disorder, unspecified: Secondary | ICD-10-CM | POA: Diagnosis not present

## 2018-10-19 DIAGNOSIS — F250 Schizoaffective disorder, bipolar type: Secondary | ICD-10-CM | POA: Diagnosis not present

## 2018-11-13 DIAGNOSIS — F250 Schizoaffective disorder, bipolar type: Secondary | ICD-10-CM | POA: Diagnosis not present

## 2018-11-26 DIAGNOSIS — F419 Anxiety disorder, unspecified: Secondary | ICD-10-CM | POA: Diagnosis not present

## 2018-11-26 DIAGNOSIS — F250 Schizoaffective disorder, bipolar type: Secondary | ICD-10-CM | POA: Diagnosis not present

## 2018-12-04 DIAGNOSIS — F419 Anxiety disorder, unspecified: Secondary | ICD-10-CM | POA: Diagnosis not present

## 2018-12-17 ENCOUNTER — Encounter: Payer: Self-pay | Admitting: Family Medicine

## 2018-12-17 ENCOUNTER — Other Ambulatory Visit: Payer: Self-pay

## 2018-12-17 ENCOUNTER — Ambulatory Visit (INDEPENDENT_AMBULATORY_CARE_PROVIDER_SITE_OTHER): Payer: Medicare Other | Admitting: Family Medicine

## 2018-12-17 DIAGNOSIS — M545 Low back pain, unspecified: Secondary | ICD-10-CM

## 2018-12-17 DIAGNOSIS — M549 Dorsalgia, unspecified: Secondary | ICD-10-CM | POA: Insufficient documentation

## 2018-12-17 MED ORDER — NAPROXEN 500 MG PO TABS: 500.0000 mg | ORAL_TABLET | Freq: Two times a day (BID) | ORAL | 0 refills | Status: AC

## 2018-12-17 MED ORDER — CYCLOBENZAPRINE HCL 5 MG PO TABS: 5.0000 mg | ORAL_TABLET | Freq: Three times a day (TID) | ORAL | 1 refills | Status: DC | PRN

## 2018-12-17 NOTE — Progress Notes (Signed)
    Subjective:  Charles Hernandez is a 48 y.o. male who presents to the [facility] today with a chief complaint of back pain.   HPI:  Charles Hernandez complains low back pain for past week and a half.  Pain started after lifting basket.  He woke up the next day and couldn't move.  He called out to his mom to help him get out of bed.  Patient reports gradual improvement in pain.  Reports similar back spasm in the past but not this bad so he wanted to come into the office.  Taking ibuprofen and stretching lower back provided some relief. Patient has put on 40 lbs since the pandemic and has not been able to work out in the gym.   There has been no perianal numbness, hx of prostate cancer, unexplained weight loss, immunosuppression, prolonged use of steroids, history of IV drug use, recent UTI, pain that is increased or unrelieved by rest, fever, bladder or bowel incontinence or trauma to the lower back.   PMHx: morbid obesity, plantar fascitis  Surg Hx: No past surgical history on file. Soc Hx:  Charles Hernandez denies smoking, drinking alcohol and illicit drug use.   Objective:  Physical Exam: BP 122/72   Pulse (!) 59   Wt (!) 325 lb 3.2 oz (147.5 kg)   SpO2 98%   BMI 46.66 kg/m    GEN:     alert, comfortable sitting upright NECK:  normal ROM, no midline tenderness RESP:  clear to auscultation bilaterally, no increased work of breathing  CVS:   regular rate and rhythm, distal pulses intact   EXT:   normal ROM,  BACK:  Moderately tender to palpation lumbar paraspinal, no midline C, T or L spine tenderness NEURO:  5/5 strength LE and UE, normal sensation, 2/4 reflexes L4 and S1 alert and oriented   Skin:   warm and dry   No results found for this or any previous visit (from the past 72 hour(s)).   Assessment/Plan:  Back pain Discussed with patient gradually returning to normal activities, as tolerated. Pt to continue ordinary activities within the limits permitted by pain. Will prescribe Naproxen sodium and  muscle relaxer for relief.  Advised patient to avoid other NSAIDs while taking this medication. Counseled patient on red flag symptoms and when to seek immediate care.  No red flags suggesting cauda equina syndrome or progressive major motor weakness. Patient to return if symptoms do not improve with conservative treatment in 2 weeks.  Consider imaging at that time.    Patient in need of annual physical. Instructed patient to schedule appointment after he returns from [place].   No orders of the defined types were placed in this encounter.   Meds ordered this encounter  Medications  . naproxen (NAPROSYN) 500 MG tablet    Sig: Take 1 tablet (500 mg total) by mouth 2 (two) times daily with a meal.    Dispense:  30 tablet    Refill:  0  . cyclobenzaprine (FLEXERIL) 5 MG tablet    Sig: Take 1 tablet (5 mg total) by mouth 3 (three) times daily as needed for muscle spasms.    Dispense:  30 tablet    Refill:  [place], DO PGY-1, [facility] 12/17/2018 10:16 AM

## 2018-12-17 NOTE — Patient Instructions (Addendum)
It was great seeing you today!  Watch for worsening symptoms such as an increasing weakness or loss of sensation legs, increasing pain and the loss of bladder or bowel function. Should any of these occur, go to the emergency department immediately.    Call to make an appointment if your pain does not improve in 2 weeks.  If you need to be seen earlier than that for any new issues we're happy to fit you in, just give us a call!    If you have questions or concerns please do not hesitate to call at [phone].     Back Exercises These exercises help to make your trunk and back strong. They also help to keep the lower back flexible. Doing these exercises can help to prevent back pain or lessen existing pain.  If you have back pain, try to do these exercises 2-3 times each day or as told by your doctor.  As you get better, do the exercises once each day. Repeat the exercises more often as told by your doctor.  To stop back pain from coming back, do the exercises once each day, or as told by your doctor. Exercises Single knee to chest Do these steps 3-5 times in a row for each leg: 1. Lie on your back on a firm bed or the floor with your legs stretched out. 2. Bring one knee to your chest. 3. Grab your knee or thigh with both hands and hold them it in place. 4. Pull on your knee until you feel a gentle stretch in your lower back or buttocks. 5. Keep doing the stretch for 10-30 seconds. 6. Slowly let go of your leg and straighten it. Pelvic tilt Do these steps 5-10 times in a row: 1. Lie on your back on a firm bed or the floor with your legs stretched out. 2. Bend your knees so they point up to the ceiling. Your feet should be flat on the floor. 3. Tighten your lower belly (abdomen) muscles to press your lower back against the floor. This will make your tailbone point up to the ceiling instead of pointing down to your feet or the floor. 4. Stay in this position for 5-10 seconds while you  gently tighten your muscles and breathe evenly. Cat-cow Do these steps until your lower back bends more easily: 1. Get on your hands and knees on a firm surface. Keep your hands under your shoulders, and keep your knees under your hips. You may put padding under your knees. 2. Let your head hang down toward your chest. Tighten (contract) the muscles in your belly. Point your tailbone toward the floor so your lower back becomes rounded like the back of a cat. 3. Stay in this position for 5 seconds. 4. Slowly lift your head. Let the muscles of your belly relax. Point your tailbone up toward the ceiling so your back forms a sagging arch like the back of a cow. 5. Stay in this position for 5 seconds.  Press-ups Do these steps 5-10 times in a row: 1. Lie on your belly (face-down) on the floor. 2. Place your hands near your head, about shoulder-width apart. 3. While you keep your back relaxed and keep your hips on the floor, slowly straighten your arms to raise the top half of your body and lift your shoulders. Do not use your back muscles. You may change where you place your hands in order to make yourself more comfortable. 4. Stay in this position for  5 seconds. 5. Slowly return to lying flat on the floor.  Bridges Do these steps 10 times in a row: 1. Lie on your back on a firm surface. 2. Bend your knees so they point up to the ceiling. Your feet should be flat on the floor. Your arms should be flat at your sides, next to your body. 3. Tighten your butt muscles and lift your butt off the floor until your waist is almost as high as your knees. If you do not feel the muscles working in your butt and the back of your thighs, slide your feet 1-2 inches farther away from your butt. 4. Stay in this position for 3-5 seconds. 5. Slowly lower your butt to the floor, and let your butt muscles relax. If this exercise is too easy, try doing it with your arms crossed over your chest. Belly crunches Do  these steps 5-10 times in a row: 1. Lie on your back on a firm bed or the floor with your legs stretched out. 2. Bend your knees so they point up to the ceiling. Your feet should be flat on the floor. 3. Cross your arms over your chest. 4. Tip your chin a little bit toward your chest but do not bend your neck. 5. Tighten your belly muscles and slowly raise your chest just enough to lift your shoulder blades a tiny bit off of the floor. Avoid raising your body higher than that, because it can put too much stress on your low back. 6. Slowly lower your chest and your head to the floor. Back lifts Do these steps 5-10 times in a row: 1. Lie on your belly (face-down) with your arms at your sides, and rest your forehead on the floor. 2. Tighten the muscles in your legs and your butt. 3. Slowly lift your chest off of the floor while you keep your hips on the floor. Keep the back of your head in line with the curve in your back. Look at the floor while you do this. 4. Stay in this position for 3-5 seconds. 5. Slowly lower your chest and your face to the floor. Contact a doctor if:  Your back pain gets a lot worse when you do an exercise.  Your back pain does not get better 2 hours after you exercise. If you have any of these problems, stop doing the exercises. Do not do them again unless your doctor says it is okay. Get help right away if:  You have sudden, very bad back pain. If this happens, stop doing the exercises. Do not do them again unless your doctor says it is okay. This information is not intended to replace advice given to you by your health care provider. Make sure you discuss any questions you have with your health care provider. Document Released: 05/21/2010 Document Revised: 01/11/2018 Document Reviewed: 01/11/2018 Elsevier Patient Education  2020 [employer].

## 2018-12-17 NOTE — Assessment & Plan Note (Signed)
Discussed with patient gradually returning to normal activities, as tolerated. Pt to continue ordinary activities within the limits permitted by pain. Will prescribe Naproxen sodium and muscle relaxer for relief.  Advised patient to avoid other NSAIDs while taking this medication. Counseled patient on red flag symptoms and when to seek immediate care.  No red flags suggesting cauda equina syndrome or progressive major motor weakness. Patient to return if symptoms do not improve with conservative treatment in 2 weeks.  Consider imaging at that time.

## 2019-01-01 DIAGNOSIS — F250 Schizoaffective disorder, bipolar type: Secondary | ICD-10-CM | POA: Diagnosis not present

## 2019-01-22 DIAGNOSIS — F250 Schizoaffective disorder, bipolar type: Secondary | ICD-10-CM | POA: Diagnosis not present

## 2019-02-18 DIAGNOSIS — F419 Anxiety disorder, unspecified: Secondary | ICD-10-CM | POA: Diagnosis not present

## 2019-02-18 DIAGNOSIS — F250 Schizoaffective disorder, bipolar type: Secondary | ICD-10-CM | POA: Diagnosis not present

## 2019-03-07 ENCOUNTER — Ambulatory Visit: Payer: Medicare Other | Admitting: Family Medicine

## 2019-03-22 ENCOUNTER — Ambulatory Visit: Payer: Medicare Other | Admitting: Family Medicine

## 2019-04-05 DIAGNOSIS — F419 Anxiety disorder, unspecified: Secondary | ICD-10-CM | POA: Diagnosis not present

## 2019-04-05 DIAGNOSIS — F250 Schizoaffective disorder, bipolar type: Secondary | ICD-10-CM | POA: Diagnosis not present

## 2019-04-15 ENCOUNTER — Other Ambulatory Visit: Payer: Self-pay

## 2019-04-15 ENCOUNTER — Ambulatory Visit (INDEPENDENT_AMBULATORY_CARE_PROVIDER_SITE_OTHER): Payer: Medicare Other | Admitting: Family Medicine

## 2019-04-15 ENCOUNTER — Encounter: Payer: Self-pay | Admitting: Family Medicine

## 2019-04-15 VITALS — BP 120/72 | HR 59 | Wt 332.6 lb

## 2019-04-15 DIAGNOSIS — L659 Nonscarring hair loss, unspecified: Secondary | ICD-10-CM

## 2019-04-15 DIAGNOSIS — E6609 Other obesity due to excess calories: Secondary | ICD-10-CM | POA: Diagnosis not present

## 2019-04-15 DIAGNOSIS — Z0000 Encounter for general adult medical examination without abnormal findings: Secondary | ICD-10-CM | POA: Diagnosis not present

## 2019-04-15 DIAGNOSIS — H6123 Impacted cerumen, bilateral: Secondary | ICD-10-CM

## 2019-04-15 LAB — POCT GLYCOSYLATED HEMOGLOBIN (HGB A1C): Hemoglobin A1C: 5.3 % (ref 4.0–5.6)

## 2019-04-15 NOTE — Progress Notes (Signed)
   Subjective:    Patient ID: Charles Hernandez, male    DOB: 07/10/1970, 48 y.o.   MRN: [id number]   CC: Physical  HPI:  Patient is a very pleasant 48yo male who presents for an annual physical.  Patient states he is doing well overall.  He denies chest pain.  He states he has been followed with psychiatry for his schizophrenia and is doing well with this.  He states he has gained a significant amount of weight due to COVID-19 pandemic limiting his ability to go to the gym.  He discussed that he is interested in seeing a nutritionist to help him better achieve his weight loss goals.  He states he has not been weighing himself recently and was shocked to see the amount of weight he had gained during a recent healthcare visit.  Cerumen impaction: Patient states he has noticed an increase in earwax in both of his ears and has not been successful in being able to get this removed.    Hair loss: He also states that he feels he has a little bit of hair loss on the anterior aspect of his right forehead compared to the left.  He request something to help him with his hair loss, but states it may be simply due to the way his hair is lying.  ROS: pertinent noted in the HPI   Pertinent PMH, PSH, FH, SoHx: PMH of schizophrenia, followed by psychiatry   Smoking status - Nonsmoker  Objective:  BP 120/72   Pulse (!) 59   Wt (!) 332 lb 9.6 oz (150.9 kg)   SpO2 100%   BMI 47.72 kg/m   Vitals and nursing note reviewed  General: NAD, pleasant, able to participate in exam HEENT: Ear wax present in both ears without other abnormalities noted, tympanic membranes difficult to appreciate due to wax buildup Cardiac: RRR, no murmurs. Respiratory: CTAB, normal effort, No wheezes, rales or rhonchi Skin: warm and dry, no rashes noted Neuro: alert, no obvious focal deficits Psych: Normal affect and mood   Assessment & Plan:    Morbid obesity ([place]) Assessment: Patient with increased weight gain due to  COVID-19 pandemic limiting his access to visit the gym.  Patient previously had been very successful in his weight loss prior to this pandemic. Plan: -Provided nutrition consult with Dr. Jenne Campus -We will check A1c and general labs today   PE (physical exam), annual Plan: -We will check lipid panel, CBC, BMP as well as an A1c  Hair loss Patient requested a treatment for hair loss. Plan: -Recommended patient give a trial of over-the-counter topical minoxidil  Excess ear wax Patient with earwax present in both the ears.  Plan: -Recommended Murine earwax system over-the-counter -Will schedule follow-up appointment for ear irrigation should patient still have issues after a trial of this over-the-counter medication    Lurline Del, [facility] PGY-1

## 2019-04-15 NOTE — Assessment & Plan Note (Signed)
Patient with earwax present in both the ears.  Plan: -Recommended Murine earwax system over-the-counter -Will schedule follow-up appointment for ear irrigation should patient still have issues after a trial of this over-the-counter medication

## 2019-04-15 NOTE — Patient Instructions (Addendum)
It was great to see you!  Our plans for today:  - For your ear wax I would like for you to try the over-the-counter Murine Ear Wax Removal System. If this is not successful we can make a follow up appointment to irrigate your ears. - For your hair loss I would like for you to start with over-the-counter topical minoxidil  - I am ordering a nutrition consult. They will contact you.  We are checking some labs today, we will call you or send you a letter if they are abnormal.   Take care and seek immediate care sooner if you develop any concerns.   Dr. [facility]

## 2019-04-15 NOTE — Assessment & Plan Note (Signed)
Assessment: Patient with increased weight gain due to COVID-19 pandemic limiting his access to visit the gym.  Patient previously had been very successful in his weight loss prior to this pandemic. Plan: -Provided nutrition consult with Dr. Jenne Campus -We will check A1c and general labs today

## 2019-04-15 NOTE — Assessment & Plan Note (Signed)
Patient requested a treatment for hair loss. Plan: -Recommended patient give a trial of over-the-counter topical minoxidil

## 2019-04-15 NOTE — Assessment & Plan Note (Signed)
Plan: -We will check lipid panel, CBC, BMP as well as an A1c

## 2019-04-16 LAB — CBC WITH DIFFERENTIAL/PLATELET
Basophils Absolute: 0 10*3/uL (ref 0.0–0.2)
Basos: 0 %
EOS (ABSOLUTE): 0.5 10*3/uL — ABNORMAL HIGH (ref 0.0–0.4)
Eos: 10 %
Hematocrit: 45.7 % (ref 37.5–51.0)
Hemoglobin: 15.3 g/dL (ref 13.0–17.7)
Immature Grans (Abs): 0 10*3/uL (ref 0.0–0.1)
Immature Granulocytes: 0 %
Lymphocytes Absolute: 1.2 10*3/uL (ref 0.7–3.1)
Lymphs: 24 %
MCH: 28.9 pg (ref 26.6–33.0)
MCHC: 33.5 g/dL (ref 31.5–35.7)
MCV: 86 fL (ref 79–97)
Monocytes Absolute: 0.3 10*3/uL (ref 0.1–0.9)
Monocytes: 6 %
Neutrophils Absolute: 3 10*3/uL (ref 1.4–7.0)
Neutrophils: 60 %
Platelets: 296 10*3/uL (ref 150–450)
RBC: 5.29 x10E6/uL (ref 4.14–5.80)
RDW: 14.4 % (ref 11.6–15.4)
WBC: 5 10*3/uL (ref 3.4–10.8)

## 2019-04-16 LAB — LIPID PANEL
Chol/HDL Ratio: 4.2 ratio (ref 0.0–5.0)
Cholesterol, Total: 198 mg/dL (ref 100–199)
HDL: 47 mg/dL (ref >39)
LDL Chol Calc (NIH): 135 mg/dL — ABNORMAL HIGH (ref 0–99)
Triglycerides: 87 mg/dL (ref 0–149)
VLDL Cholesterol Cal: 16 mg/dL (ref 5–40)

## 2019-04-16 LAB — BASIC METABOLIC PANEL
BUN/Creatinine Ratio: 19 (ref 9–20)
BUN: 17 mg/dL (ref 6–24)
CO2: 21 mmol/L (ref 20–29)
Calcium: 9.1 mg/dL (ref 8.7–10.2)
Chloride: 104 mmol/L (ref 96–106)
Creatinine, Ser: 0.9 mg/dL (ref 0.76–1.27)
GFR calc Af Amer: 117 mL/min/{1.73_m2} (ref >59)
GFR calc non Af Amer: 101 mL/min/{1.73_m2} (ref >59)
Glucose: 86 mg/dL (ref 65–99)
Potassium: 4.9 mmol/L (ref 3.5–5.2)
Sodium: 139 mmol/L (ref 134–144)

## 2019-04-17 ENCOUNTER — Encounter: Payer: Self-pay | Admitting: Family Medicine

## 2019-05-13 DIAGNOSIS — F250 Schizoaffective disorder, bipolar type: Secondary | ICD-10-CM | POA: Diagnosis not present

## 2019-05-13 DIAGNOSIS — F419 Anxiety disorder, unspecified: Secondary | ICD-10-CM | POA: Diagnosis not present

## 2019-06-03 DIAGNOSIS — F250 Schizoaffective disorder, bipolar type: Secondary | ICD-10-CM | POA: Diagnosis not present

## 2019-06-11 ENCOUNTER — Telehealth: Payer: Self-pay | Admitting: *Deleted

## 2019-06-11 NOTE — Telephone Encounter (Signed)
LVM to call office to go over screening questions prior to visit tomorrow.Charles Hernandez, CMA  

## 2019-06-12 ENCOUNTER — Other Ambulatory Visit: Payer: Self-pay

## 2019-06-12 ENCOUNTER — Ambulatory Visit (INDEPENDENT_AMBULATORY_CARE_PROVIDER_SITE_OTHER): Payer: Medicare Other | Admitting: Family Medicine

## 2019-06-12 ENCOUNTER — Encounter: Payer: Self-pay | Admitting: Family Medicine

## 2019-06-12 VITALS — BP 104/66 | HR 76 | Ht 70.0 in | Wt 343.4 lb

## 2019-06-12 DIAGNOSIS — Z23 Encounter for immunization: Secondary | ICD-10-CM | POA: Diagnosis not present

## 2019-06-12 DIAGNOSIS — H6123 Impacted cerumen, bilateral: Secondary | ICD-10-CM

## 2019-06-12 MED ORDER — DEBROX 6.5 % OT SOLN: 5.0000 [drp] | Freq: Every day | OTIC | 0 refills | Status: AC

## 2019-06-12 NOTE — Progress Notes (Signed)
   CHIEF COMPLAINT / HPI:  Impacted cerumen Mr. Charles Hernandez reports that he has had difficulty hearing out of his left ear for the past week or so.  He was previously had issues with significant earwax production and has had his ears washed out previously.  He was recently seen by his PCP who recommended that he return to clinic if he began having trouble hearing and we can washout his ears.  PERTINENT  PMH / PSH: Noncontributory   OBJECTIVE: BP 104/66   Pulse 76   Ht 5\' 10"  (1.778 m)   Wt (!) 343 lb 6 oz (155.8 kg)   SpO2 97%   BMI 49.27 kg/m    HEENT: Right ear: Visible tympanic membranes.  Minimal cerumen observed. Left ear: Light-colored cerumen totally occluding the lumen and obscuring the tympanic membrane.  Exam following ear lavage demonstrated a healthy tympanic membrane.  ASSESSMENT / PLAN:  Cerumen impaction Ear lavage successful.  Hearing improved.  Tympanic membrane visualized. -Debrox drops prescribed for home use to help remove additional earwax.   Desire for flu vaccination -Flu vaccine given  , MD [facility]

## 2019-06-12 NOTE — Assessment & Plan Note (Addendum)
Ear lavage successful.  Hearing improved.  Tympanic membrane visualized. -Debrox drops prescribed for home use to help remove additional earwax.

## 2019-06-12 NOTE — Patient Instructions (Signed)
Great to see you today!  I'm glad we were able to get a lot of that cerumen out!  I can see your ear drums on both sides and they look good.    After one week, you can try using Debrox in your left ear to help dissolve the remaining wax.

## 2019-07-01 DIAGNOSIS — F250 Schizoaffective disorder, bipolar type: Secondary | ICD-10-CM | POA: Diagnosis not present

## 2019-07-01 DIAGNOSIS — F419 Anxiety disorder, unspecified: Secondary | ICD-10-CM | POA: Diagnosis not present

## 2019-08-02 DIAGNOSIS — F250 Schizoaffective disorder, bipolar type: Secondary | ICD-10-CM | POA: Diagnosis not present

## 2019-08-05 DIAGNOSIS — F250 Schizoaffective disorder, bipolar type: Secondary | ICD-10-CM | POA: Diagnosis not present

## 2019-08-05 DIAGNOSIS — F419 Anxiety disorder, unspecified: Secondary | ICD-10-CM | POA: Diagnosis not present

## 2019-09-18 DIAGNOSIS — F250 Schizoaffective disorder, bipolar type: Secondary | ICD-10-CM | POA: Diagnosis not present

## 2019-09-18 DIAGNOSIS — F419 Anxiety disorder, unspecified: Secondary | ICD-10-CM | POA: Diagnosis not present

## 2019-10-11 ENCOUNTER — Ambulatory Visit (INDEPENDENT_AMBULATORY_CARE_PROVIDER_SITE_OTHER): Payer: Medicare Other | Admitting: Family Medicine

## 2019-10-11 ENCOUNTER — Other Ambulatory Visit: Payer: Self-pay

## 2019-10-11 ENCOUNTER — Ambulatory Visit (HOSPITAL_COMMUNITY)
Admission: RE | Admit: 2019-10-11 | Discharge: 2019-10-11 | Disposition: A | Discharge status: Home / Self Care | Payer: Medicare Other | Source: Ambulatory Visit | Attending: Family Medicine | Admitting: Family Medicine

## 2019-10-11 VITALS — BP 110/60 | HR 71 | Wt 349.5 lb

## 2019-10-11 DIAGNOSIS — M7989 Other specified soft tissue disorders: Secondary | ICD-10-CM | POA: Insufficient documentation

## 2019-10-11 NOTE — Patient Instructions (Addendum)
It was nice to meet you today,  I am concerned that your swelling could be caused by something called a deep vein thrombosis.  This is a potentially serious condition that we need to rule out with an ultrasound of your leg.    Please go to [facility] vascular center at [facility] today at 3:00 for an ultrasound of your leg.  If it is a DVT, then we will need to treat with blood thinners and I will call you to discuss this further.  If it is not a DVT, then it is likely dependent edema.  Either way I would advise you to try to limit the time you spend on the couch, do not have your leg dangling off the edge or in any position that could cut off the circulation to your legs.  When you are sitting or laying down, try to keep your legs elevated above your waist.  I will call you with results when I get them,  If you have any difficulty breathing at any time, please go to the emergency department.  Have a great day,  Frederic Jericho, MD

## 2019-10-11 NOTE — Assessment & Plan Note (Addendum)
1 day history of right leg swelling.  No erythema, nontender to palpation.  No warmth.  There is some obvious difference in circumference.  Given patient's obesity, and sedentary lifestyle there is concerned that this is a DVT.  I have scheduled the patient for an appointment for an ultrasound today.  Very unlikely that this is cellulitis.  If not DVT, then most likely dependent edema. -Follow-up with patient after ultrasound. -Patient informed that if he has trouble breathing, chest pain then he should go to the emergency department.  If not a DVT then patient should manage conservatively and return to clinic if swelling does not continue to decrease over time.

## 2019-10-11 NOTE — Progress Notes (Signed)
    SUBJECTIVE:   CHIEF COMPLAINT / HPI:   Right leg swelling: Patient noticed yesterday morning when he tried to put on socks that his right foot and calf were swollen.  He went to the [facility] later that day and the trainer told him that it was "water retention".  He walked around the track several times.  He cannot think of anything in particular that caused his swelling to occur, but does say that he has been working out with heavier weights lately and may have injured it that way.  He also states that his bed is broken and he has been sleeping on his couch which is too small for him and uncomfortable.  He says that his right leg often hangs off the side of the couch.  He was on the couch from 10 PM last night till 5 AM watching movies and then fell asleep.  He has no pain in the leg.  There is no tingling sensation or numbness.  Nobody in his family has a history of clotting disorders he is aware of.  He thinks the swelling is better today compared to yesterday.  PERTINENT  PMH / PSH:   OBJECTIVE:   BP 110/60   Pulse 71   Wt (!) 349 lb 8 oz (158.5 kg)   SpO2 98%   BMI 50.15 kg/m   General: Alert and oriented.  Obese male.  No acute distress. CV: Regular rate and rhythm. Pulmonary: Lungs clear to auscultation bilaterally, no decreased air movement, no wheezing, no crackles Extremities: Right leg is grossly larger than the left leg below the knee.  Calf circumference is 42 cm on right, 39 cm on left.  There is nonpitting edema on the right leg that is worse on the dorsal aspect of the foot.  No tenderness to palpation.  No erythema.  ASSESSMENT/PLAN:   Leg swelling 1 day history of right leg swelling.  No erythema, nontender to palpation.  No warmth.  There is some obvious difference in circumference.  Given patient's obesity, and sedentary lifestyle there is concerned that this is a DVT.  I have scheduled the patient for an appointment for an ultrasound today.  Very unlikely that this is  cellulitis.  If not DVT, then most likely dependent edema. -Follow-up with patient after ultrasound. -Patient informed that if he has trouble breathing, chest pain then he should go to the emergency department.  If not a DVT then patient should manage conservatively and return to clinic if swelling does not continue to decrease over time.     Sandre Kitty, MD [facility]

## 2019-10-14 ENCOUNTER — Ambulatory Visit (HOSPITAL_COMMUNITY): Payer: Medicare Other

## 2019-10-15 ENCOUNTER — Encounter (HOSPITAL_COMMUNITY): Payer: Medicare Other

## 2019-10-18 ENCOUNTER — Other Ambulatory Visit: Payer: Self-pay

## 2019-10-18 ENCOUNTER — Ambulatory Visit (INDEPENDENT_AMBULATORY_CARE_PROVIDER_SITE_OTHER): Payer: Medicare Other | Admitting: Family Medicine

## 2019-10-18 VITALS — BP 128/82 | HR 73 | Ht 70.0 in | Wt 350.6 lb

## 2019-10-18 DIAGNOSIS — M7989 Other specified soft tissue disorders: Secondary | ICD-10-CM | POA: Diagnosis not present

## 2019-10-18 LAB — D-DIMER, QUANTITATIVE: D-DIMER: 0.58 mg/L FEU — ABNORMAL HIGH (ref 0.00–0.49)

## 2019-10-18 MED ORDER — MEDICAL COMPRESSION STOCKINGS MISC: 1 refills | Status: DC

## 2019-10-18 NOTE — Progress Notes (Signed)
    SUBJECTIVE:   CHIEF COMPLAINT / HPI:   Leg swelling: Patient continues to have leg swelling in the right leg.  He denies a history of cancer, surgery, or trauma to the leg.  He believes it may have occurred after doing some leg press movements at the gym.  Currently at the gym he is only doing light cardio and upper body movements.  At rest he elevates it which he believes helps but is still swollen throughout the day.  He continues to have no pain associated with this.  Patient states he schedule an appoint with the sports medicine doctors, but that he needs a referral.  PERTINENT  PMH / PSH: Morbid obesity  OBJECTIVE:   BP 128/82   Pulse 73   Ht 5\' 10"  (1.778 m)   Wt (!) 350 lb 9.6 oz (159 kg)   SpO2 96%   BMI 50.31 kg/m   General: Patient alert.  No acute distress.  Patient's mother is with him. MSK: Patient's right leg continues to be approximately 3 cm larger in circumference than the left.  This week it is more erythematous in the calf, but not warm to touch and nontender.  No swelling behind the knee.   ASSESSMENT/PLAN:   No problem-specific Assessment & Plan notes found for this encounter.     , MD [facility]

## 2019-10-18 NOTE — Patient Instructions (Addendum)
I will get a lab called a D-dimer.  Based on the results of this we may need to repeat the ultrasound.  Please wear these stockings during the day especially when you are on your feet.  You can take them off at night or when laying down.  I would like you to follow back up with me in 1 week.  Have a great day,  Danielson, MD   Chronic Venous Insufficiency Chronic venous insufficiency is a condition where the leg veins cannot effectively pump blood from the legs to the heart. This happens when the vein walls are either stretched, weakened, or damaged, or when the valves inside the vein are damaged. With the right treatment, you should be able to continue with an active life. This condition is also called venous stasis. What are the causes? Common causes of this condition include:  High blood pressure inside the veins (venous hypertension).  Sitting or standing too long, causing increased blood pressure in the leg veins.  A blood clot that blocks blood flow in a vein (deep vein thrombosis, DVT).  Inflammation of a vein (phlebitis) that causes a blood clot to form.  Tumors in the pelvis that cause blood to back up. What increases the risk? The following factors may make you more likely to develop this condition:  Having a family history of this condition.  Obesity.  Pregnancy.  Living without enough regular physical activity or exercise (sedentary lifestyle).  Smoking.  Having a job that requires long periods of standing or sitting in one place.  Being a certain age. Women in their 71s and 91s and men in their 11s are more likely to develop this condition. What are the signs or symptoms? Symptoms of this condition include:  Veins that are enlarged, bulging, or twisted (varicose veins).  Skin breakdown or ulcers.  Reddened skin or dark discoloration of skin on the leg between the knee and ankle.  Brown, smooth, tight, and painful skin just above the ankle, usually on the  inside of the leg (lipodermatosclerosis).  Swelling of the legs. How is this diagnosed? This condition may be diagnosed based on:  Your medical history.  A physical exam.  Tests, such as: ? A procedure that creates an image of a blood vessel and nearby organs and provides information about blood flow through the blood vessel (duplex ultrasound). ? A procedure that tests blood flow (plethysmography). ? A procedure that looks at the veins using X-ray and dye (venogram). How is this treated? The goals of treatment are to help you return to an active life and to minimize pain or disability. Treatment depends on the severity of your condition, and it may include:  Wearing compression stockings. These can help relieve symptoms and help prevent your condition from getting worse. However, they do not cure the condition.  Sclerotherapy. This procedure involves an injection of a solution that shrinks damaged veins.  Surgery. This may involve: ? Removing a diseased vein (vein stripping). ? Cutting off blood flow through the vein (laser ablation surgery). ? Repairing or reconstructing a valve within the affected vein. Follow these instructions at home:      Wear compression stockings as told by your health care provider. These stockings help to prevent blood clots and reduce swelling in your legs.  Take over-the-counter and prescription medicines only as told by your health care provider.  Stay active by exercising, walking, or doing different activities. Ask your health care provider what activities are safe for you  and how much exercise you need.  Drink enough fluid to keep your urine pale yellow.  Do not use any products that contain nicotine or tobacco, such as cigarettes, e-cigarettes, and chewing tobacco. If you need help quitting, ask your health care provider.  Keep all follow-up visits as told by your health care provider. This is important. Contact a health care provider if  you:  Have redness, swelling, or more pain in the affected area.  See a red streak or line that goes up or down from the affected area.  Have skin breakdown or skin loss in the affected area, even if the breakdown is small.  Get an injury in the affected area. Get help right away if:  You get an injury and an open wound in the affected area.  You have: ? Severe pain that does not get better with medicine. ? Sudden numbness or weakness in the foot or ankle below the affected area. ? Trouble moving your foot or ankle. ? A fever. ? Worse or persistent symptoms. ? Chest pain. ? Shortness of breath. Summary  Chronic venous insufficiency is a condition where the leg veins cannot effectively pump blood from the legs to the heart.  Chronic venous insufficiency occurs when the vein walls become stretched, weakened, or damaged, or when valves within the vein are damaged.  Treatment depends on how severe your condition is. It often involves wearing compression stockings and may involve having a procedure.  Make sure you stay active by exercising, walking, or doing different activities. Ask your health care provider what activities are safe for you and how much exercise you need. This information is not intended to replace advice given to you by your health care provider. Make sure you discuss any questions you have with your health care provider. Document Revised: 01/09/2018 Document Reviewed: 01/09/2018 Elsevier Patient Education  2020 [employer].

## 2019-10-20 NOTE — Assessment & Plan Note (Signed)
Patient's last DVT ultrasound was negative, but his swelling has not improved.  The unilaterality of it and the sudden onset without pain or tenderness still make me think this is possibly a DVT despite the ultrasound being negative.  Other most likely scenario is musculoskeletal injury.  Patient has already made an appointment with sports medicine.  I think this is appropriate and have put in a referral to sports medicine.  We will also get a D-dimer and if this is elevated get a repeat DVT ultrasound.  Have also prescribed the patient compression stockings and advised him to continue to ambulate but refrain from doing heavy lifting or strenuous exercise and to keep the foot elevated when not upright.

## 2019-10-22 ENCOUNTER — Other Ambulatory Visit: Payer: Self-pay

## 2019-10-22 ENCOUNTER — Ambulatory Visit
Admission: RE | Admit: 2019-10-22 | Discharge: 2019-10-22 | Disposition: A | Discharge status: Home / Self Care | Payer: Medicare Other | Source: Ambulatory Visit | Attending: Sports Medicine | Admitting: Sports Medicine

## 2019-10-22 ENCOUNTER — Ambulatory Visit (INDEPENDENT_AMBULATORY_CARE_PROVIDER_SITE_OTHER): Payer: Medicare Other | Admitting: Sports Medicine

## 2019-10-22 VITALS — BP 139/82 | Ht 70.0 in

## 2019-10-22 DIAGNOSIS — M7989 Other specified soft tissue disorders: Secondary | ICD-10-CM

## 2019-10-22 DIAGNOSIS — M79661 Pain in right lower leg: Secondary | ICD-10-CM

## 2019-10-22 NOTE — Patient Instructions (Signed)
Go get the x-rays of your right leg at your earliest convenience. Please call [facility] to schedule your MRI. We will call you with the results. Call us if you have any questions.

## 2019-10-23 ENCOUNTER — Encounter: Payer: Self-pay | Admitting: Family Medicine

## 2019-10-23 ENCOUNTER — Ambulatory Visit (HOSPITAL_COMMUNITY)
Admission: RE | Admit: 2019-10-23 | Discharge: 2019-10-23 | Disposition: A | Discharge status: Home / Self Care | Payer: Medicare Other | Source: Ambulatory Visit | Attending: Family Medicine | Admitting: Family Medicine

## 2019-10-23 ENCOUNTER — Ambulatory Visit (INDEPENDENT_AMBULATORY_CARE_PROVIDER_SITE_OTHER): Payer: Medicare Other | Admitting: Family Medicine

## 2019-10-23 DIAGNOSIS — M7989 Other specified soft tissue disorders: Secondary | ICD-10-CM

## 2019-10-23 NOTE — Patient Instructions (Signed)
It was nice to see you again today,  Please go to your appointment to get the DVT ultrasound today.  I will call you with the results of that when I get them, most likely Wednesday the 24th between 130 and 5 PM.  I will call your mother and leave a voicemail if I cannot reach you.  Please continue to wrap the leg with an Ace bandage or compression stocking.  If there is no evidence of DVT, I do not need to see you for another 3 to 4 weeks to give it time to heal on its own.  If there is evidence of a DVT I will call you to schedule an appointment sooner.  Otherwise you can just schedule an appointment for 3 weeks from now or 4 weeks.  Have a great day,  Frederic Jericho, MD

## 2019-10-23 NOTE — Progress Notes (Signed)
   Subjective:    Patient ID: Charles Hernandez, male    DOB: 05/19/1970, 49 y.o.   MRN: [id number]  HPI chief complaint: Right lower leg swelling  49 year old male comes in today complaining of sudden onset right lower leg swelling that began about a month ago.  He states he was working out in the gym doing leg extensions when he went to adjust his seat and awkwardly extended the right leg against the extension bar.  This caused the foam padding to roll up his leg from his ankle to about the mid shin.  He had some mild discomfort at the time but the following day noticed significant swelling in his leg.  He was seen by his primary care physician and a Doppler was ordered to rule out DVT.  This study was negative.  Swelling extends down into his foot and ankle.  He does state that the swelling in his foot and ankle have started to improve.  He denies any significant pain.  Denies similar issues in the past.  No pain in his knee.  Past medical history reviewed Medications reviewed Allergies reviewed    Review of Systems As above    Objective:   Physical Exam  Obese.  No acute distress.  Awake alert and oriented x3.  Vital signs reviewed  Right lower leg.  There is significant diffuse swelling of the right lower leg just below the knee and extending into the foot and ankle.  There is approximately a 3 cm difference in the size of the right lower leg compared to the left.  2-3+ pitting edema from the ankle up until just below the knee.  No knee effusion.  Normal quad size when compared to the left leg.  No tenderness to palpation.  Good pulses.  Slightly antalgic gait.  Bedside ultrasound of the right lower leg shows no obvious muscle injury or hematoma.  There is extensive soft tissue swelling involving the entire lower leg.  Please note that this ultrasound was not done to rule out DVT.  X-ray of the right lower leg including AP and lateral tib-fib views show no obvious bony abnormality       Assessment & Plan:   Right lower leg swelling of unknown etiology  I am going to order an MRI of the right lower leg specifically to rule out muscle injury or hematoma not seen on ultrasound.  Patient has a repeat Doppler tomorrow to rule out DVT.  This was ordered by his PCP and the patient understands the importance of proceeding with this repeat study just to ensure that the initial scan was not falsely negative.  Patient was given an Ace wrap with instructions to elevate the affected extremity is much as possible.  Phone follow-up with MRI results when available.  We will delineate further treatment based on those findings.

## 2019-10-23 NOTE — Progress Notes (Signed)
    SUBJECTIVE:   CHIEF COMPLAINT / HPI:   Right leg swelling: Patient went to [facility] yesterday and states that the ultrasound they did do no driving unusual and he got a x-ray afterwards of the right leg.  He is still not having any pain in the right leg.  No difficulty walking.  He is still trying to exercise, but taking things lightly.  Wants to know when he can go back to working out at [employer] as he states this is when he usually loses most of the weight, by attending multiple group exercise classes a day.  He has been wrapping his leg and an Ace wrap since yesterday and feels like the swelling is already improved.   PERTINENT  PMH / PSH: Schizophrenia  OBJECTIVE:   BP 130/74   Pulse 78   SpO2 98%   General: Alert.  Oriented.  No acute distress MSK: Left leg normal in appearance.  Right leg has diffuse nonpitting edema below the knee.  Slightly improved from last visit, although still a 2 to 3 cm difference in size between right and left leg.  Nontender to palpation.  Normal gait.  ASSESSMENT/PLAN:   Leg swelling Previous DVT ultrasound was negative, follow-up D-dimer was slightly elevated so I ordered a repeat DVT ultrasound that patient will get this afternoon.  Patient saw sports medicine yesterday to help rule out musculoskeletal cause of leg swelling.  Swelling appears slightly improved with the addition of a Ace wrap.  Patient still has not picked up his compression stockings yet but states he will do so soon.  Patient eager to start exercising again.  If this DVT ultrasound is also negative I believe it is safe for patient to start working back to her normal daily exercise routine.  Advised him to avoid heavy lifting for now and start with low intensity cardiovascular exercises.     Sandre Kitty, MD [facility]

## 2019-10-24 NOTE — Assessment & Plan Note (Signed)
Previous DVT ultrasound was negative, follow-up D-dimer was slightly elevated so I ordered a repeat DVT ultrasound that patient will get this afternoon.  Patient saw sports medicine yesterday to help rule out musculoskeletal cause of leg swelling.  Swelling appears slightly improved with the addition of a Ace wrap.  Patient still has not picked up his compression stockings yet but states he will do so soon.  Patient eager to start exercising again.  If this DVT ultrasound is also negative I believe it is safe for patient to start working back to her normal daily exercise routine.  Advised him to avoid heavy lifting for now and start with low intensity cardiovascular exercises.

## 2019-11-26 ENCOUNTER — Ambulatory Visit
Admission: RE | Admit: 2019-11-26 | Discharge: 2019-11-26 | Disposition: A | Discharge status: Home / Self Care | Payer: Medicare Other | Source: Ambulatory Visit | Attending: Sports Medicine | Admitting: Sports Medicine

## 2019-11-26 DIAGNOSIS — M79661 Pain in right lower leg: Secondary | ICD-10-CM

## 2019-11-26 DIAGNOSIS — R600 Localized edema: Secondary | ICD-10-CM | POA: Diagnosis not present

## 2019-11-29 ENCOUNTER — Telehealth: Payer: Self-pay

## 2019-11-29 NOTE — Telephone Encounter (Signed)
Patient LVM on nurse line requesting MRI results from 7/27. Looks like this was ordered by sports medicine. Will forward to PCP.

## 2019-11-29 NOTE — Telephone Encounter (Signed)
Called pt to let him know that Dr. Margaretha Sheffield reviewed the MRI that he had done on his right leg. There is no evidence of muscle or bone injury but his swelling may be related to either kidney problems, heart problems, or liver problems. He needs to follow-up with his primary care physician to discuss these findings as well as further medical work-up. There is nothing to be done from a musculoskeletal standpoint.   Will relay this message once pt calls back.

## 2019-12-02 NOTE — Patient Instructions (Signed)
It was great to see you!  Our plans for today:  -We are checking some labs today to further evaluate your leg swelling -I would like for you to continue wearing your compression stockings for the leg swelling.  You should also work on maintaining a low-sodium diet.  I provided some information about this below. -You can consider "heel cups".  These can help take the pressure off of your right heel.  I recommend that you back off of your aerobics classes and dancing until your heel improves.  This should happen in the next few weeks.    We are checking some labs today, I will call you if they are abnormal will send you a MyChart message or a letter if they are normal.  If you do not hear about your labs in the next 2 weeks please let us know.  Take care and seek immediate care sooner if you develop any concerns.   Dr. [facility]

## 2019-12-02 NOTE — Progress Notes (Signed)
SUBJECTIVE:   CHIEF COMPLAINT / HPI:   Right lower limb pain Patient previously followed with sports medicine and received an MRI recently to evaluate right lower limb pain.  MRI did not show any acute muscular or bony injury but did have the following nonspecific finding.  MRI with nonspecific circumferential subcutaneous edema with layering fluid overlying the superficial investing fascia of the lower extremity musculature which is nonspecific finding and can include lymphedema, cardiac or hepatic insufficiency, nephrotic syndrome, venous insufficiency or less likely cellulitis.  Patient presents today from sports medicine to be evaluated for the possibility of the above conditions.  Patient states that his right lower limb pain started after doing a machine in the gym which he slipped on and struck his anterior shin of his right leg.  He states that over the past few weeks he has had some edema in both his right and left leg.  He was previously screened in his right leg for possibility of a DVT which was negative.  He states he continues to use compression stockings for this edema but recently ripped them.  Right heel pain Patient states this right heel pain started shortly after his right lower limb pain.  He states that he was taking an aerobics class which involved a lot of jumping and moving around, shortly thereafter he went to an outdoor concert where he did a lot of dancing and noted the next day that he had significant pain in the heel of his right foot when stepping.  He states this pain is not present at rest but is an 8 out of 10 when walking on it.  This pain is located to the ball of his heel and is not present in the middle of his foot similar to plantar fasciitis which has had before.  He believes this is not like the plantar fasciitis as it is located only in the heel.  Bilateral lower limb edema Patient states he has had this edema for some time.  He initially had more edema of  the right leg after his injury and therefore had a work-up for DVT which was negative.  Patient recently had an MRI of his right lower leg for the injury which did not show any musculoskeletal cause.  Patient states he has been wearing compression stockings but unfortunately ripped these during a session at the gym.  He denies other symptoms.  PERTINENT  PMH / PSH: History of schizophrenia  OBJECTIVE:   BP 118/64   Pulse 72   SpO2 98%    General: NAD, pleasant, able to participate in exam Extremities: Bilateral edema of the lower extremities with no pitting but patient does have some indentation where his cuff socks are present.  Patient with some pain to palpation of the anterior aspect of the right leg.  Patient's right heel with some pain to palpation along the central area of the calcaneus with no pain to palpation of the Achilles.  Patient without pain to palpation of the central foot suggest against plantar fasciitis. Psych: Normal affect and mood  ASSESSMENT/PLAN:   Inflammatory heel pain, right Patient with 2-3 weeks of right heel pain that started after taking an aerobics class shortly followed by dancing at an outdoor concert.  Patient states that using extra padding seems to help with this pain and it only hurts with pressure directly on the heel.  Most likely etiology is irritation/inflammation from overwork stressing the heel.  No concerns with plantar fasciitis with  no pain to palpation of the central foot and no pain with dorsiflexion of the foot.  Low concern for Achilles tendinitis with no pain to palpation of the Achilles tendon or through passive stretching of the Achilles tendon. Plan: -Recommended patient use "heel cups" to minimize stress through his heels. -Discussed with patient to consider changing shoes for better support -Discussed with patient recommendation to back off of aerobic exercise and exercise involves pounding through his heels for a few days to give it time  to improve.  Leg swelling Assessment: Patient following up after right lower limb MRI which showed nonspecific circumferential subcutaneous edema with recommendation to evaluate patient for cardiac, hepatic, renal/nephrotic symptoms as well as venous insufficiency as potential cause for this nonspecific finding.  Patient initially had the right lower limb more swollen than the left but states that this has improved some and now the legs are equal in swelling, though there is nonpitting edema present in both.  Patient previously evaluated in the right leg for DVT with ultrasound, no concern for DVT in the left leg with no increased swelling versus the right, no history of DVT, patient has not been sedentary, and has no injury recently to his left leg. Plan: -We will check CBC with differential, CMP, urinalysis -Recommend patient continue use compression stockings for his lower limb edema -Patient states he is supposed to follow-up with sports medicine and plans to make an appointment to do so -Provided patient a new prescription for compression stockings as he states he tore his other ones. -Urinalysis with no signs of nephrotic syndrome   We will also screen patient for hepatitis C today  Jackelyn Poling, DO [facility]

## 2019-12-03 ENCOUNTER — Ambulatory Visit (INDEPENDENT_AMBULATORY_CARE_PROVIDER_SITE_OTHER): Payer: Medicare Other | Admitting: Sports Medicine

## 2019-12-03 ENCOUNTER — Ambulatory Visit (INDEPENDENT_AMBULATORY_CARE_PROVIDER_SITE_OTHER): Payer: Medicare Other | Admitting: Family Medicine

## 2019-12-03 ENCOUNTER — Encounter: Payer: Self-pay | Admitting: Family Medicine

## 2019-12-03 ENCOUNTER — Other Ambulatory Visit: Payer: Self-pay

## 2019-12-03 ENCOUNTER — Encounter: Payer: Self-pay | Admitting: Sports Medicine

## 2019-12-03 VITALS — BP 118/64 | HR 72

## 2019-12-03 DIAGNOSIS — Z1159 Encounter for screening for other viral diseases: Secondary | ICD-10-CM

## 2019-12-03 DIAGNOSIS — M7989 Other specified soft tissue disorders: Secondary | ICD-10-CM | POA: Diagnosis not present

## 2019-12-03 DIAGNOSIS — F419 Anxiety disorder, unspecified: Secondary | ICD-10-CM | POA: Diagnosis not present

## 2019-12-03 DIAGNOSIS — F250 Schizoaffective disorder, bipolar type: Secondary | ICD-10-CM | POA: Diagnosis not present

## 2019-12-03 DIAGNOSIS — M722 Plantar fascial fibromatosis: Secondary | ICD-10-CM | POA: Diagnosis not present

## 2019-12-03 DIAGNOSIS — M79671 Pain in right foot: Secondary | ICD-10-CM | POA: Diagnosis not present

## 2019-12-03 DIAGNOSIS — E119 Type 2 diabetes mellitus without complications: Secondary | ICD-10-CM

## 2019-12-03 LAB — POCT URINALYSIS DIP (MANUAL ENTRY)
Bilirubin, UA: NEGATIVE
Blood, UA: NEGATIVE
Glucose, UA: NEGATIVE mg/dL
Ketones, POC UA: NEGATIVE mg/dL
Leukocytes, UA: NEGATIVE
Nitrite, UA: NEGATIVE
Protein Ur, POC: NEGATIVE mg/dL
Spec Grav, UA: 1.025 (ref 1.010–1.025)
Urobilinogen, UA: 0.2 E.U./dL
pH, UA: 5.5 (ref 5.0–8.0)

## 2019-12-03 MED ORDER — MEDICAL COMPRESSION STOCKINGS MISC: 1 refills | Status: AC

## 2019-12-03 NOTE — Assessment & Plan Note (Addendum)
Assessment: Patient following up after right lower limb MRI which showed nonspecific circumferential subcutaneous edema with recommendation to evaluate patient for cardiac, hepatic, renal/nephrotic symptoms as well as venous insufficiency as potential cause for this nonspecific finding.  Patient initially had the right lower limb more swollen than the left but states that this has improved some and now the legs are equal in swelling, though there is nonpitting edema present in both.  Patient previously evaluated in the right leg for DVT with ultrasound, no concern for DVT in the left leg with no increased swelling versus the right, no history of DVT, patient has not been sedentary, and has no injury recently to his left leg. Plan: -We will check CBC with differential, CMP, urinalysis -Recommend patient continue use compression stockings for his lower limb edema -Patient states he is supposed to follow-up with sports medicine and plans to make an appointment to do so -Provided patient a new prescription for compression stockings as he states he tore his other ones. -Urinalysis with no signs of nephrotic syndrome

## 2019-12-03 NOTE — Patient Instructions (Signed)
You have plantar fasciitis.  Start doing the plantar fascial stretches 2-3 times a day.  Ice the bottom of your foot with a frozen water bottle for 5 to 10 minutes at the end of the day.  Try a Strassburg sock at night while sleeping.  Wear the green sports insoles for arch support.  Also use the arch strap.  We can try physical therapy if these things do not help.

## 2019-12-03 NOTE — Assessment & Plan Note (Signed)
Patient with 2-3 weeks of right heel pain that started after taking an aerobics class shortly followed by dancing at an outdoor concert.  Patient states that using extra padding seems to help with this pain and it only hurts with pressure directly on the heel.  Most likely etiology is irritation/inflammation from overwork stressing the heel.  No concerns with plantar fasciitis with no pain to palpation of the central foot and no pain with dorsiflexion of the foot.  Low concern for Achilles tendinitis with no pain to palpation of the Achilles tendon or through passive stretching of the Achilles tendon. Plan: -Recommended patient use "heel cups" to minimize stress through his heels. -Discussed with patient to consider changing shoes for better support -Discussed with patient recommendation to back off of aerobic exercise and exercise involves pounding through his heels for a few days to give it time to improve.

## 2019-12-04 LAB — CBC WITH DIFFERENTIAL/PLATELET
Basophils Absolute: 0 10*3/uL (ref 0.0–0.2)
Basos: 0 %
EOS (ABSOLUTE): 0.6 10*3/uL — ABNORMAL HIGH (ref 0.0–0.4)
Eos: 9 %
Hematocrit: 42 % (ref 37.5–51.0)
Hemoglobin: 14.1 g/dL (ref 13.0–17.7)
Immature Grans (Abs): 0 10*3/uL (ref 0.0–0.1)
Immature Granulocytes: 0 %
Lymphocytes Absolute: 1.4 10*3/uL (ref 0.7–3.1)
Lymphs: 23 %
MCH: 28.8 pg (ref 26.6–33.0)
MCHC: 33.6 g/dL (ref 31.5–35.7)
MCV: 86 fL (ref 79–97)
Monocytes Absolute: 0.5 10*3/uL (ref 0.1–0.9)
Monocytes: 8 %
Neutrophils Absolute: 3.6 10*3/uL (ref 1.4–7.0)
Neutrophils: 60 %
Platelets: 298 10*3/uL (ref 150–450)
RBC: 4.9 x10E6/uL (ref 4.14–5.80)
RDW: 14.4 % (ref 11.6–15.4)
WBC: 6.1 10*3/uL (ref 3.4–10.8)

## 2019-12-04 LAB — COMPREHENSIVE METABOLIC PANEL
ALT: 19 IU/L (ref 0–44)
AST: 16 IU/L (ref 0–40)
Albumin/Globulin Ratio: 1.5 (ref 1.2–2.2)
Albumin: 4.1 g/dL (ref 4.0–5.0)
Alkaline Phosphatase: 115 IU/L (ref 48–121)
BUN/Creatinine Ratio: 14 (ref 9–20)
BUN: 13 mg/dL (ref 6–24)
Bilirubin Total: 0.2 mg/dL (ref 0.0–1.2)
CO2: 22 mmol/L (ref 20–29)
Calcium: 9 mg/dL (ref 8.7–10.2)
Chloride: 104 mmol/L (ref 96–106)
Creatinine, Ser: 0.95 mg/dL (ref 0.76–1.27)
GFR calc Af Amer: 110 mL/min/{1.73_m2} (ref >59)
GFR calc non Af Amer: 95 mL/min/{1.73_m2} (ref >59)
Globulin, Total: 2.7 g/dL (ref 1.5–4.5)
Glucose: 88 mg/dL (ref 65–99)
Potassium: 4.4 mmol/L (ref 3.5–5.2)
Sodium: 140 mmol/L (ref 134–144)
Total Protein: 6.8 g/dL (ref 6.0–8.5)

## 2019-12-04 LAB — TSH: TSH: 2.26 u[IU]/mL (ref 0.450–4.500)

## 2019-12-04 LAB — HEPATITIS C ANTIBODY: Hep C Virus Ab: <0.1 s/co ratio (ref 0.0–0.9)

## 2019-12-04 NOTE — Progress Notes (Signed)
   Subjective:    Patient ID: Charles Hernandez, male    DOB: 12/29/1970, 49 y.o.   MRN: [id number]  HPI chief complaint: Right heel pain  Patient comes in today complaining of 2 weeks heel pain.  He localizes his pain to the plantar aspect of the heel at the calcaneal origin of the plantar fascia.  He got some heel cups earlier today but still having pain.  Denies pain on the dorsum of his foot.  He has a history of plantar fasciitis in the left heel previously.  No known trauma.  Unrelated to today's visit, patient recently had an MRI of his lower leg which showed diffuse swelling of unknown etiology which is being addressed by his primary care physician.    Review of Systems    As above Objective:   Physical Exam  Obese.  No acute distress  Right foot: Moderately diffuse soft tissue swelling involving the entire right lower leg.  Patient is tender to palpation of the calcaneal origin of the plantar fascia.  No ecchymosis.  Negative calcaneal squeeze.  Good pulses.       Assessment & Plan:  Plantar fasciitis  In addition to the gel cup, we have fitted him with a green sports insole with a scaphoid pad for additional arch support.  I have also given him an arch strap to wear when active.  I recommended that he consider a Strassburg sock at night when sleeping.  He is educated and plantar fascial stretching and daily icing techniques.  If symptoms persist, consider formal physical therapy.  Follow-up as needed.

## 2020-01-07 ENCOUNTER — Other Ambulatory Visit: Payer: Self-pay

## 2020-01-07 ENCOUNTER — Ambulatory Visit (INDEPENDENT_AMBULATORY_CARE_PROVIDER_SITE_OTHER): Payer: Medicare Other | Admitting: Sports Medicine

## 2020-01-07 VITALS — BP 131/86 | Ht 70.0 in | Wt 350.0 lb

## 2020-01-07 DIAGNOSIS — M722 Plantar fascial fibromatosis: Secondary | ICD-10-CM

## 2020-01-08 NOTE — Progress Notes (Addendum)
   Subjective:    Patient ID: Charles Hernandez, male    DOB: 1970-06-25, 49 y.o.   MRN: [id number]  HPI   Patient comes in today with returning right heel pain.  He was last seen in the office on August 3 and diagnosed with plantar fasciitis.  He was given green sports insoles and scaphoid pads and instructed in plantar fascial stretching.  He also purchased a foot bath which he found very helpful but unfortunately he left that in [place] when visiting last week.  His pain has started to return.  He localizes it to the plantar aspect of the right heel.  He does not notice it anywhere else.  He is asking about the possibility of custom orthotics.    Review of Systems    As above Objective:   Physical Exam  Well-developed, well-nourished.  No acute distress.  Awake alert and oriented x3.  Right heel: There is tenderness to palpation at the calcaneal origen of the plantar fascia.  Negative calcaneal squeeze.  Moderate lower extremity edema diffusely.      Assessment & Plan:   Returning right heel pain secondary to plantar fasciitis  Patient is in the process of acquiring a new foot bath.  This seems to help his plantar fascial pain significantly.  I would like to get an x-ray of his right calcaneus.  Phone follow-up with those results when available.  I did explain to him that we could do custom orthotics but I want him to check with his insurance first so that he is not surprised by the cost if his insurance does not cover them.  Addendum (01/16/2020): X-ray reviewed.  Unremarkable other than a tiny calcaneal spur.

## 2020-01-13 ENCOUNTER — Other Ambulatory Visit: Payer: Self-pay

## 2020-01-13 ENCOUNTER — Ambulatory Visit
Admission: RE | Admit: 2020-01-13 | Discharge: 2020-01-13 | Disposition: A | Discharge status: Home / Self Care | Payer: Medicare Other | Source: Ambulatory Visit | Attending: Sports Medicine | Admitting: Sports Medicine

## 2020-01-13 DIAGNOSIS — M722 Plantar fascial fibromatosis: Secondary | ICD-10-CM

## 2020-01-13 DIAGNOSIS — M7731 Calcaneal spur, right foot: Secondary | ICD-10-CM | POA: Diagnosis not present

## 2020-02-19 DIAGNOSIS — F250 Schizoaffective disorder, bipolar type: Secondary | ICD-10-CM | POA: Diagnosis not present

## 2020-02-19 DIAGNOSIS — F419 Anxiety disorder, unspecified: Secondary | ICD-10-CM | POA: Diagnosis not present

## 2020-02-20 DIAGNOSIS — F250 Schizoaffective disorder, bipolar type: Secondary | ICD-10-CM | POA: Diagnosis not present

## 2020-02-20 DIAGNOSIS — F209 Schizophrenia, unspecified: Secondary | ICD-10-CM | POA: Diagnosis not present

## 2020-02-20 DIAGNOSIS — F419 Anxiety disorder, unspecified: Secondary | ICD-10-CM | POA: Diagnosis not present

## 2020-02-25 ENCOUNTER — Other Ambulatory Visit: Payer: Self-pay

## 2020-02-25 ENCOUNTER — Ambulatory Visit (INDEPENDENT_AMBULATORY_CARE_PROVIDER_SITE_OTHER): Payer: Medicare Other

## 2020-02-25 DIAGNOSIS — Z23 Encounter for immunization: Secondary | ICD-10-CM | POA: Diagnosis not present

## 2020-02-25 NOTE — Progress Notes (Signed)
Patient presents in [facility] for Flu Vaccine.   Vaccine administered RD without complication.   See admin for details.  

## 2020-04-02 ENCOUNTER — Ambulatory Visit (INDEPENDENT_AMBULATORY_CARE_PROVIDER_SITE_OTHER): Payer: Medicare Other | Admitting: Family Medicine

## 2020-04-02 ENCOUNTER — Encounter: Payer: Self-pay | Admitting: Family Medicine

## 2020-04-02 ENCOUNTER — Other Ambulatory Visit: Payer: Self-pay

## 2020-04-02 VITALS — BP 120/72 | HR 65 | Wt 324.4 lb

## 2020-04-02 DIAGNOSIS — M7731 Calcaneal spur, right foot: Secondary | ICD-10-CM | POA: Diagnosis not present

## 2020-04-02 NOTE — Assessment & Plan Note (Signed)
Assessment: Tiny right calcaneal bone spur on x-ray with patient complaining of pain in that region which has been limiting his exercise.  Patient has been exercising has lost 25 pounds in the past 6 months due to his exercise with his heel pain limits his ability to exercise.  He has been evaluated by sports medicine who recommended reaching out to his insurance company to see about custom orthotics.  Patient states he has been unable to do this but request a referral to podiatry to see if they can fit him with some sort of orthotic or other shoe to help with his discomfort in that calcaneal area.  Physical exam only shows pain in the calcaneal area with palpation he does not seem to have pain to the plantar fascia with stretching or with palpation. Plan: -Referral to podiatry sent -Recommended patient reach out to his insurance company to see if they would cover orthotics as that was the recommendation from sports medicine -Follow-up as needed

## 2020-04-02 NOTE — Progress Notes (Signed)
    SUBJECTIVE:   CHIEF COMPLAINT / HPI:   Right Heel pain: Started 6 months ago after he increased his activity in "uncomfortable sneakers", and had a big event of dancing at a concert in August and noticed his heel pain after that. He previously saw sports medicine on 9/7 and had xrays which showed a calcaneal spur. Sports medicine discussed the possibility of custom orthotics.  Patient states he is interested in a podiatrist that takes medicaid to consider orthotics or other custom shoes.  Patient states he lost 25lbs in 6 months due to exercise but that his heel pain sometimes limits his ability to exercise.  Vision checkup: Patient also request provider send a referral for an optometrist to evaluate his vision.  He states he has no concerns about his vision but has not had it evaluated in several years and wants to make sure that he is still approximately 20/20 or 20/25 as he has been in the past.  Denies complaints with this issue but states he needs to find someone that takes Medicaid.  PERTINENT  PMH / PSH: Right calcaneal bone spur  OBJECTIVE:   BP 120/72   Pulse 65   Wt (!) 324 lb 6.4 oz (147.1 kg)   SpO2 99%   BMI 46.55 kg/m    General: NAD, pleasant, able to participate in exam Respiratory: No respiratory distress MSK: Mild tenderness to palpation of right calcaneous, no discomfort with stretching plantar fascia. No tenderness to palpation of left achiles.  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  Patient has been exercising has lost 25 pounds in the past 6 months due to his exercise with his heel pain limits his ability to exercise.  He has been evaluated by sports medicine who recommended reaching out to his insurance company to see about  custom orthotics.  Patient states he has been unable to do this but request a referral to podiatry to see if they can fit him with some sort of orthotic or other shoe to help with his discomfort in that calcaneal area.  Physical exam only shows pain in the calcaneal area with palpation he does not seem to have pain to the plantar fascia with stretching or with palpation. Plan: -Referral to podiatry sent -Recommended patient reach out to his insurance company to see if they would cover orthotics as that was the recommendation from sports medicine -Follow-up as needed   Vision checkup: Recommended patient reach out to his insurance company to find an optometrist in the area that cares for Medicaid patients per his request.  Discussed with patient that he does not need a referral from a provider in order to see an optometrist and that this would be the appropriate person for him to see with no specific medical concerns with his vision and simply checking his current vision level.   Jackelyn Poling, DO [facility]    This note was prepared using Dragon voice recognition software and may include unintentional dictation errors due to the inherent limitations of voice recognition software.

## 2020-04-02 NOTE — Patient Instructions (Signed)
It was great to see you! Thank you for allowing me to participate in your care!  Our plans for today:  -Today we discussed your bone spur.  I put in a referral for podiatry.  They should contact you in the next 1 to 2 weeks.  Let me know if they do not. -For your eye exam I recommend that you reach out to your insurance company back on the number on the back of your insurance card to ask for an optometrist in the area that covers Medicaid. -At our next appointment we can discuss any other concerns that you may have -You are currently up-to-date on your blood work  Take care and seek immediate care sooner if you develop any concerns.   Dr. Jackelyn Poling, DO [facility]

## 2020-04-09 ENCOUNTER — Other Ambulatory Visit: Payer: Self-pay

## 2020-04-09 ENCOUNTER — Ambulatory Visit (INDEPENDENT_AMBULATORY_CARE_PROVIDER_SITE_OTHER): Payer: Medicare Other

## 2020-04-09 ENCOUNTER — Ambulatory Visit (INDEPENDENT_AMBULATORY_CARE_PROVIDER_SITE_OTHER): Payer: Medicare Other | Admitting: Podiatry

## 2020-04-09 DIAGNOSIS — G8929 Other chronic pain: Secondary | ICD-10-CM | POA: Diagnosis not present

## 2020-04-09 DIAGNOSIS — M79672 Pain in left foot: Secondary | ICD-10-CM

## 2020-04-09 DIAGNOSIS — M722 Plantar fascial fibromatosis: Secondary | ICD-10-CM

## 2020-04-13 NOTE — Progress Notes (Signed)
Subjective:   Patient ID: Charles Hernandez, male   DOB: 49 y.o.   MRN: [id number]   HPI 49 year old male presents the office today for concerns his pain on his left heel.  He states he had an injury to his right leg in June 2021.  MRI in July of this leg as well as venous duplex.  He feels he has been compensating causing pain not causing pain to the heel.  He denies any specific injury to the foot.  Pain is intermittent.  No increased warmth to the heel.  He has no other concerns today.   Review of Systems  All other systems reviewed and are negative.  Past Medical History:  Diagnosis Date  . Mental disability   . Schizophrenia (HCC) 1993    No past surgical history on file.   Current Outpatient Medications:  .  busPIRone (BUSPAR) 5 MG tablet, Take 5 mg by mouth 3 (three) times daily., Disp: , Rfl: 1 .  carbamide peroxide (DEBROX) 6.5 % OTIC solution, Place 5 drops into the left ear daily., Disp: 15 mL, Rfl: 0 .  cyclobenzaprine (FLEXERIL) 5 MG tablet, Take 1 tablet (5 mg total) by mouth 3 (three) times daily as needed for muscle spasms., Disp: 30 tablet, Rfl: 1 .  Elastic Bandages & Supports (MEDICAL COMPRESSION STOCKINGS) MISC, Please wear these compression stocking during the day., Disp: 2 each, Rfl: 1 .  levocetirizine (XYZAL) 5 MG tablet, Take 1 tablet (5 mg total) by mouth daily., Disp: 30 tablet, Rfl: 11 .  naproxen (NAPROSYN) 500 MG tablet, Take 1 tablet (500 mg total) by mouth 2 (two) times daily with a meal., Disp: 30 tablet, Rfl: 0 .  perphenazine (TRILAFON) 4 MG tablet, Take 4 mg by mouth 3 (three) times daily., Disp: , Rfl: 1  No Known Allergies        Objective:  Physical Exam  General: AAO x3, NAD  Dermatological: Skin is warm, dry and supple bilateral.  There are no open sores, no preulcerative lesions, no rash or signs of infection present.  Vascular: Dorsalis Pedis artery and Posterior Tibial artery pedal pulses are 2/4 bilateral with immedate capillary fill time.  There is no pain with calf compression, swelling, warmth, erythema.   Neruologic: Grossly intact via light touch bilateral.  Negative Tinel sign.  Musculoskeletal: There is tenderness palpation on plantar aspect of the heel along the insertion of plantar fascia on the left side.  I will scope the distal portion Achilles tendon on the insertion of the posterior calcaneus.  There is no edema, erythema.  Flexor, extensor tendons appear to be intact.  Muscular strength 5/5 in all groups tested bilateral.  Flatfeet present  Gait: Unassisted, Nonantalgic.       Assessment:   Plantar fasciitis, tendinitis left heel    Plan:  -Treatment options discussed including all alternatives, risks, and complications -Etiology of symptoms were discussed -X-rays obtained reviewed.  No evidence of acute fracture. -Power step inserts were dispensed also discussed shoe modifications -Physical therapy -Stretching, icing daily. -Consider steroid injection if needed.  Vivi Barrack DPM

## 2020-04-16 ENCOUNTER — Ambulatory Visit: Payer: Medicare Other | Admitting: Family Medicine

## 2020-05-15 ENCOUNTER — Ambulatory Visit: Payer: Medicare Other

## 2020-10-05 DIAGNOSIS — F419 Anxiety disorder, unspecified: Secondary | ICD-10-CM | POA: Diagnosis not present

## 2020-10-05 DIAGNOSIS — F250 Schizoaffective disorder, bipolar type: Secondary | ICD-10-CM | POA: Diagnosis not present

## 2021-02-01 IMAGING — CR DG OS CALCIS 2+V*R*
2 series · 2 of 2 positions shown · non-contrast
Comparison: None

CLINICAL DATA: RIGHT calcaneal pain, injury in the gym in October 2019

EXAM:
RIGHT OS CALCIS - 2+ VIEW

[t calcaneus axial right]
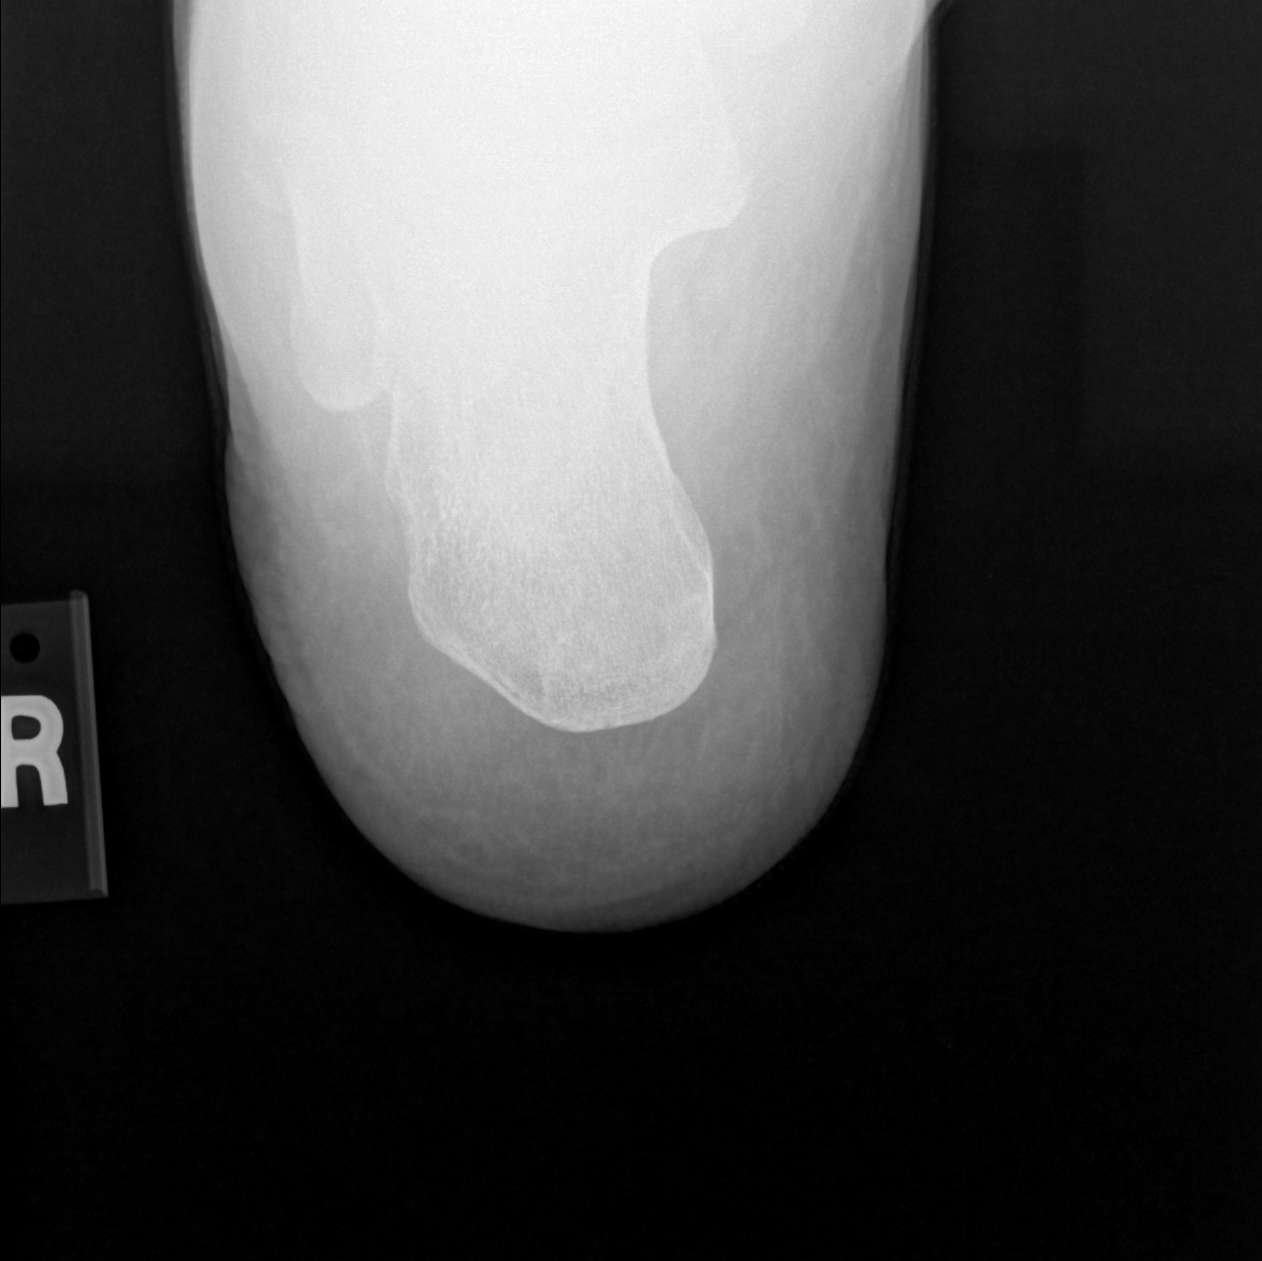

[t calcaneus lat right]
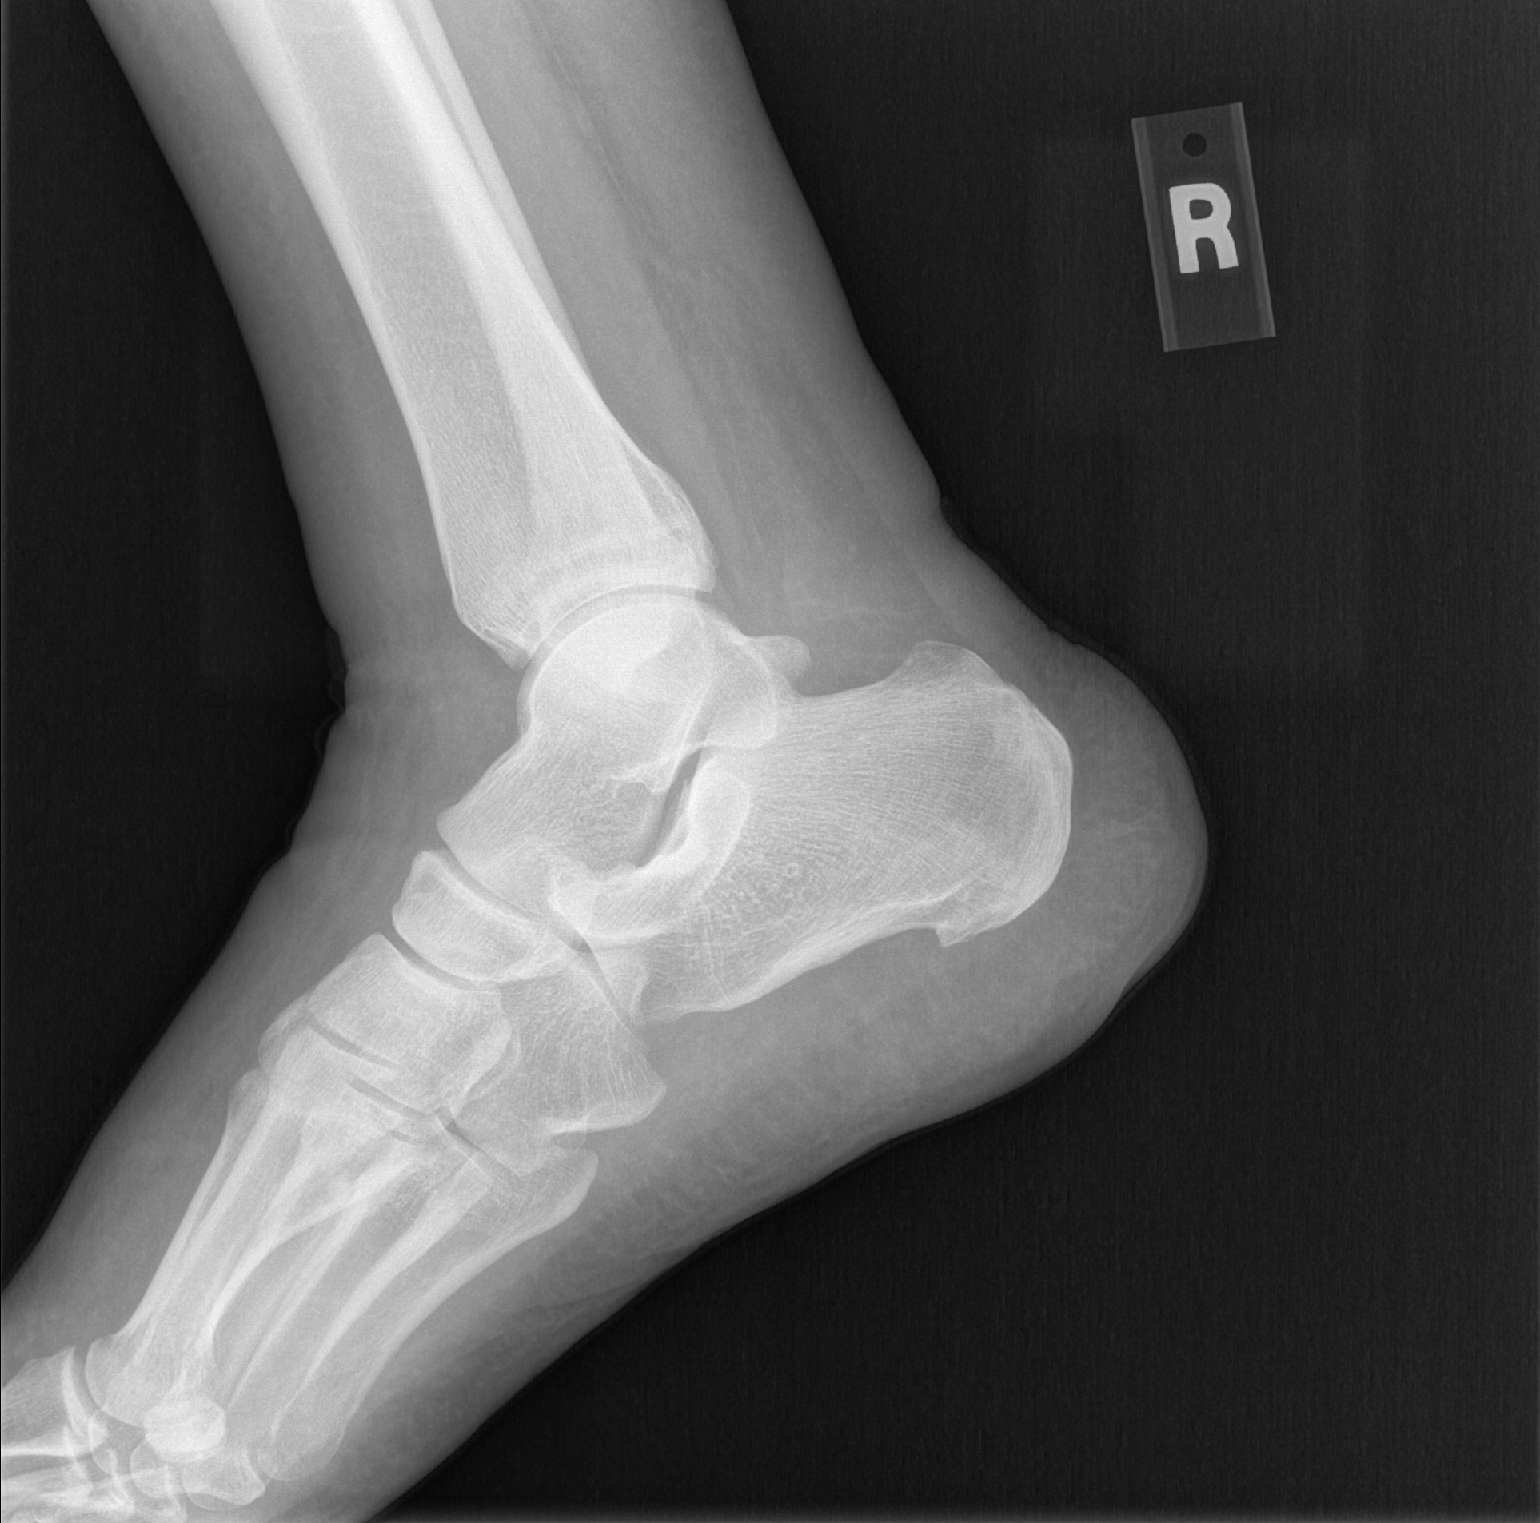

[2 of 2 positions shown; findings below may reference images not displayed]

FINDINGS: Osseous mineralization normal.

Tiny plantar calcaneal spur.

Joint spaces preserved.

No fracture, dislocation, or bone destruction.

Soft tissues unremarkable.
IMPRESSION: Tiny plantar calcaneal spur.

## 2021-02-08 DIAGNOSIS — F32A Depression, unspecified: Secondary | ICD-10-CM | POA: Diagnosis not present

## 2021-02-08 DIAGNOSIS — Z114 Encounter for screening for human immunodeficiency virus [HIV]: Secondary | ICD-10-CM | POA: Diagnosis not present

## 2021-02-08 DIAGNOSIS — L659 Nonscarring hair loss, unspecified: Secondary | ICD-10-CM | POA: Diagnosis not present

## 2021-02-08 DIAGNOSIS — F209 Schizophrenia, unspecified: Secondary | ICD-10-CM | POA: Diagnosis not present

## 2021-02-08 DIAGNOSIS — Z0000 Encounter for general adult medical examination without abnormal findings: Secondary | ICD-10-CM | POA: Diagnosis not present

## 2021-02-08 DIAGNOSIS — Z131 Encounter for screening for diabetes mellitus: Secondary | ICD-10-CM | POA: Diagnosis not present

## 2021-02-08 DIAGNOSIS — Z1159 Encounter for screening for other viral diseases: Secondary | ICD-10-CM | POA: Diagnosis not present

## 2021-02-22 DIAGNOSIS — Z0189 Encounter for other specified special examinations: Secondary | ICD-10-CM | POA: Diagnosis not present

## 2021-02-22 DIAGNOSIS — F32A Depression, unspecified: Secondary | ICD-10-CM | POA: Diagnosis not present

## 2021-02-22 DIAGNOSIS — F419 Anxiety disorder, unspecified: Secondary | ICD-10-CM | POA: Diagnosis not present

## 2021-02-22 DIAGNOSIS — F250 Schizoaffective disorder, bipolar type: Secondary | ICD-10-CM | POA: Diagnosis not present

## 2021-02-22 DIAGNOSIS — Z789 Other specified health status: Secondary | ICD-10-CM | POA: Diagnosis not present

## 2021-04-20 DIAGNOSIS — F250 Schizoaffective disorder, bipolar type: Secondary | ICD-10-CM | POA: Diagnosis not present

## 2021-04-22 ENCOUNTER — Ambulatory Visit: Payer: Medicare Other

## 2021-04-23 ENCOUNTER — Ambulatory Visit (INDEPENDENT_AMBULATORY_CARE_PROVIDER_SITE_OTHER): Payer: Medicare Other | Admitting: Family Medicine

## 2021-04-23 ENCOUNTER — Other Ambulatory Visit: Payer: Self-pay

## 2021-04-23 VITALS — BP 135/87 | HR 82 | Ht 70.0 in | Wt 345.2 lb

## 2021-04-23 DIAGNOSIS — Z0000 Encounter for general adult medical examination without abnormal findings: Secondary | ICD-10-CM

## 2021-04-23 DIAGNOSIS — Z23 Encounter for immunization: Secondary | ICD-10-CM | POA: Diagnosis not present

## 2021-04-23 DIAGNOSIS — H9202 Otalgia, left ear: Secondary | ICD-10-CM | POA: Diagnosis not present

## 2021-04-23 NOTE — Patient Instructions (Signed)
Thank you for coming to see me today. It was a pleasure.   I will send ion a referral to ENT to have further evaluation  Avoid putting qtips in ears or inserting anything in ears that may cause damage.  Can take Tylenol or Ibuprofen as needed for pain  Avoid submerging in water  Please follow-up with PCP as needed  If you have any questions or concerns, please do not hesitate to call the office at [phone].  Best,   Dana Allan, MD

## 2021-04-23 NOTE — Progress Notes (Signed)
° ° °  SUBJECTIVE:   CHIEF COMPLAINT / HPI: busted eardrum  Patient reports increased discomfort in left ear for 1 month.  Initially had been listening to music with ear buds with increased volume and noticed a sharp popping in left ear. Denies any decreased hearing, ear discharge or tinnitus.  Has not use any qtips in ears and reports no recent swimming.  Reports some relief when in sauna at gym.   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 Appears that  No discharge appreciated. Tenderness over tragus of left ear. No TMJ pain, clicking of joint.  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   Refer ENT for further evaluation Avoid submerging ears in water, using qtips Tylenol for discomfort as needed Strict return precautions provided  Healthcare maintenance Flu vaccine today Declined COVID ad PNA vaccines today Follow up with PCP for ongoing HCM     , MD [facility]

## 2021-04-25 ENCOUNTER — Encounter: Payer: Self-pay | Admitting: Family Medicine

## 2021-04-25 DIAGNOSIS — H9202 Otalgia, left ear: Secondary | ICD-10-CM | POA: Insufficient documentation

## 2021-04-25 DIAGNOSIS — Z0000 Encounter for general adult medical examination without abnormal findings: Secondary | ICD-10-CM | POA: Insufficient documentation

## 2021-04-25 NOTE — Assessment & Plan Note (Signed)
Flu vaccine today Declined COVID ad PNA vaccines today Follow up with PCP for ongoing HCM

## 2021-04-25 NOTE — Assessment & Plan Note (Signed)
Possible ruptured TM without discharge or decrease in hearing.   Refer ENT for further evaluation Avoid submerging ears in water, using qtips Tylenol for discomfort as needed Strict return precautions provided

## 2021-04-30 DIAGNOSIS — L649 Androgenic alopecia, unspecified: Secondary | ICD-10-CM | POA: Diagnosis not present

## 2021-05-17 ENCOUNTER — Other Ambulatory Visit: Payer: Self-pay

## 2021-05-17 ENCOUNTER — Ambulatory Visit (HOSPITAL_COMMUNITY)
Admission: EM | Admit: 2021-05-17 | Discharge: 2021-05-17 | Disposition: A | Discharge status: Home / Self Care | Payer: Medicare Other | Attending: Urgent Care | Admitting: Urgent Care

## 2021-05-17 ENCOUNTER — Ambulatory Visit (HOSPITAL_BASED_OUTPATIENT_CLINIC_OR_DEPARTMENT_OTHER): Admit: 2021-05-17 | Discharge: 2021-05-17 | Disposition: A | Discharge status: Home / Self Care | Payer: Medicare Other

## 2021-05-17 ENCOUNTER — Encounter (HOSPITAL_COMMUNITY): Payer: Self-pay

## 2021-05-17 DIAGNOSIS — Z8249 Family history of ischemic heart disease and other diseases of the circulatory system: Secondary | ICD-10-CM | POA: Diagnosis present

## 2021-05-17 DIAGNOSIS — R600 Localized edema: Secondary | ICD-10-CM | POA: Diagnosis present

## 2021-05-17 DIAGNOSIS — M7989 Other specified soft tissue disorders: Secondary | ICD-10-CM | POA: Diagnosis not present

## 2021-05-17 NOTE — ED Triage Notes (Signed)
Pt presents with right leg swelling for past few days from unknown source.

## 2021-05-17 NOTE — ED Notes (Signed)
[place] scheduled with cardiovascular for 1pm. Pt is aware.

## 2021-05-17 NOTE — Progress Notes (Signed)
Lower extremity venous RT study completed.   Please see CV Proc for preliminary results.   Reginia Battie, RDMS, RVT  

## 2021-05-17 NOTE — Discharge Instructions (Addendum)
You have an appointment today at 1:00pm for a vascular ultrasound of your right lower extremity. We will call you with the results and start you on medication if it is positive for a DVT. If it is negative, we would recommend following up with your PCP as there are many other reasons your leg could become swollen.

## 2021-05-17 NOTE — ED Provider Notes (Signed)
[facility]    CSN: [id number] Arrival date & time: 05/17/21  1036      History   Chief Complaint Chief Complaint  Patient presents with   Leg Swelling    HPI Charles Hernandez is a 51 y.o. male.   Pleasant 51 year old male presents today with concerns regarding right lower extremity edema.  He is he is physically active at the gym and noticed several days ago while doing the leg press that his right lower extremity looked larger than his left.  He states he was requested to come get an evaluation today as his mother has a history of DVT.  He was hoping to go up to [place] on a trip but wanted to rule out a blood clot first.  He states he has had issues with this right lower extremity in the past and in fact had an MRI of his right lower extremity in 2021 due to a similar issue.  The MRI was reviewed with patient today which showed nonspecific circumferential subcutaneous edema of the right lower extremity with similar findings in the left back in 2021 the differentials included lymphedema, cardiac or hepatic insufficiency, nephrotic syndrome, venous insufficiency, or less likely cellulitis.  Patient states he has been dealing with plantar fasciitis primarily of his right heel for several months, and routinely wears an ankle brace in addition to 2 Ace wraps.  He denies a sedentary lifestyle despite his weight.  He states he has never personally had a blood clot in the past.  He denies any chest pain or shortness of breath.    Past Medical History:  Diagnosis Date   Mental disability    Schizophrenia [facility]) 1993    Patient Active Problem List   Diagnosis Date Noted   Ear pain, left 04/25/2021   Healthcare maintenance 04/25/2021   Bone spur of inferior portion of right calcaneus 04/02/2020   Inflammatory heel pain, right 12/03/2019   Leg swelling 10/11/2019   Back pain 12/17/2018   Plantar fasciitis of left foot 11/08/2016   Allergic rhinitis 10/04/2016   Cerumen impaction  01/11/2015   Hair loss 11/21/2013   PE (physical exam), annual 10/03/2013   Morbid obesity (HCC) 10/03/2013   Acne 06/26/2011   SCHIZOPHRENIA 06/29/2006    History reviewed. No pertinent surgical history.     Home Medications    Prior to Admission medications   Medication Sig Start Date End Date Taking? Authorizing Provider  busPIRone (BUSPAR) 5 MG tablet Take 5 mg by mouth 3 (three) times daily. 10/19/16   [provider]  carbamide peroxide (DEBROX) 6.5 % OTIC solution Place 5 drops into the left ear daily. 06/12/19   Mirian Mo, MD  cyclobenzaprine (FLEXERIL) 5 MG tablet Take 1 tablet (5 mg total) by mouth 3 (three) times daily as needed for muscle spasms. 12/17/18   Katha Cabal, DO  Elastic Bandages & Supports (MEDICAL COMPRESSION STOCKINGS) MISC Please wear these compression stocking during the day. 12/03/19   Jackelyn Poling, DO  levocetirizine (XYZAL) 5 MG tablet Take 1 tablet (5 mg total) by mouth daily. 10/20/17   Tillman Sers, DO  naproxen (NAPROSYN) 500 MG tablet Take 1 tablet (500 mg total) by mouth 2 (two) times daily with a meal. 12/17/18   Brimage, Vondra, DO  perphenazine (TRILAFON) 4 MG tablet Take 4 mg by mouth 3 (three) times daily. 10/19/16   [provider]    Family History Family History  Problem Relation Age of Onset  Cancer Mother    Cancer Sister     Social History Social History   Tobacco Use   Smoking status: Never   Smokeless tobacco: Never  Substance Use Topics   Alcohol use: No   Drug use: No     Allergies   Patient has no known allergies.   Review of Systems Review of Systems  Constitutional: Negative.   HENT: Negative.    Respiratory:  Negative for cough, chest tightness and shortness of breath.   Cardiovascular:  Positive for leg swelling (bilateral, R > L). Negative for chest pain and palpitations.  Musculoskeletal:  Positive for arthralgias (R plantar fasciitis).    Physical Exam Triage Vital Signs ED  Triage Vitals  Enc Vitals Group     BP 05/17/21 1146 131/72     Pulse Rate 05/17/21 1146 65     Resp 05/17/21 1146 18     Temp 05/17/21 1146 97.7 F (36.5 C)     Temp Source 05/17/21 1146 Oral     SpO2 05/17/21 1146 99 %     Weight --      Height --      Head Circumference --      Peak Flow --      Pain Score 05/17/21 1144 5     Pain Loc --      Pain Edu? --      Excl. in GC? --    No data found.  Updated Vital Signs BP 131/72 (BP Location: Right Arm)    Pulse 65    Temp 97.7 F (36.5 C) (Oral)    Resp 18    SpO2 99%   Visual Acuity Right Eye Distance:   Left Eye Distance:   Bilateral Distance:    Right Eye Near:   Left Eye Near:    Bilateral Near:     Physical Exam Vitals and nursing note reviewed.  Constitutional:      General: He is not in acute distress.    Appearance: He is obese. He is not ill-appearing or toxic-appearing.  HENT:     Head: Normocephalic.  Cardiovascular:     Rate and Rhythm: Normal rate.  Pulmonary:     Effort: Pulmonary effort is normal. No respiratory distress.     Breath sounds: No wheezing.  Musculoskeletal:        General: Swelling (bilateral lower extremities, R>L) present. No tenderness (to R heel), deformity or signs of injury. Normal range of motion.     Cervical back: Neck supple.     Right lower leg: Edema present.     Left lower leg: Edema present.  Skin:    General: Skin is warm.     Capillary Refill: Capillary refill takes less than 2 seconds.     Findings: Erythema (bilateral lower extremities WITHOUT warmth) present. No rash.  Neurological:     General: No focal deficit present.     Mental Status: He is alert.     Sensory: No sensory deficit.     Motor: No weakness.     Gait: Gait normal.  Psychiatric:        Mood and Affect: Mood normal.     UC Treatments / Results  Labs (all labs ordered are listed, but only abnormal results are displayed) Labs Reviewed - No data to display  EKG   Radiology No results  found.  Procedures Procedures (including critical care time)  Medications Ordered in UC Medications - No data to display  Initial  Impression / Assessment and Plan / UC Course  I have reviewed the triage vital signs and the nursing notes.  Pertinent labs & imaging results that were available during my care of the patient were reviewed by me and considered in my medical decision making (see chart for details).     Edema of right lower extremity -DVT rule out ultrasound scheduled for 1:00 this afternoon.  We will call patient's with result of the study and treat if positive.  Patient does in fact have edema on both legs however which looks clinically consistent with chronic venous insufficiency and/or lymphedema Family history of blood clots -as above  Final Clinical Impressions(s) / UC Diagnoses   Final diagnoses:  Edema of right lower extremity  Family history of blood clots     Discharge Instructions      You have an appointment today at 1:00pm for a vascular ultrasound of your right lower extremity. We will call you with the results and start you on medication if it is positive for a DVT. If it is negative, we would recommend following up with your PCP as there are many other reasons your leg could become swollen.     ED Prescriptions   None    PDMP not reviewed this encounter.   Maretta BeesCrain, Otha Rickles L, [place] 05/17/21 1239

## 2022-09-01 ENCOUNTER — Encounter: Payer: Medicare Other | Admitting: Family Medicine

## 2022-09-08 ENCOUNTER — Encounter: Payer: Self-pay | Admitting: Family Medicine

## 2022-09-08 ENCOUNTER — Ambulatory Visit (INDEPENDENT_AMBULATORY_CARE_PROVIDER_SITE_OTHER): Payer: Medicare Other | Admitting: Family Medicine

## 2022-09-08 VITALS — BP 126/72 | HR 61 | Ht 70.0 in | Wt 323.6 lb

## 2022-09-08 DIAGNOSIS — J302 Other seasonal allergic rhinitis: Secondary | ICD-10-CM | POA: Diagnosis not present

## 2022-09-08 DIAGNOSIS — F419 Anxiety disorder, unspecified: Secondary | ICD-10-CM | POA: Diagnosis not present

## 2022-09-08 DIAGNOSIS — Z13228 Encounter for screening for other metabolic disorders: Secondary | ICD-10-CM | POA: Diagnosis not present

## 2022-09-08 DIAGNOSIS — Z23 Encounter for immunization: Secondary | ICD-10-CM

## 2022-09-08 DIAGNOSIS — K0889 Other specified disorders of teeth and supporting structures: Secondary | ICD-10-CM

## 2022-09-08 DIAGNOSIS — Z1211 Encounter for screening for malignant neoplasm of colon: Secondary | ICD-10-CM

## 2022-09-08 DIAGNOSIS — G8929 Other chronic pain: Secondary | ICD-10-CM | POA: Diagnosis not present

## 2022-09-08 DIAGNOSIS — Z131 Encounter for screening for diabetes mellitus: Secondary | ICD-10-CM | POA: Diagnosis not present

## 2022-09-08 DIAGNOSIS — Z0000 Encounter for general adult medical examination without abnormal findings: Secondary | ICD-10-CM

## 2022-09-08 DIAGNOSIS — M545 Low back pain, unspecified: Secondary | ICD-10-CM | POA: Diagnosis not present

## 2022-09-08 MED ORDER — CYCLOBENZAPRINE HCL 5 MG PO TABS
5.0000 mg | ORAL_TABLET | Freq: Three times a day (TID) | ORAL | 1 refills | Status: AC | PRN
Start: 2022-09-08 — End: ?

## 2022-09-08 MED ORDER — BUSPIRONE HCL 5 MG PO TABS
5.0000 mg | ORAL_TABLET | Freq: Three times a day (TID) | ORAL | 1 refills | Status: AC
Start: 2022-09-08 — End: ?

## 2022-09-08 MED ORDER — IBUPROFEN 600 MG PO TABS
600.0000 mg | ORAL_TABLET | Freq: Three times a day (TID) | ORAL | 0 refills | Status: AC | PRN
Start: 2022-09-08 — End: ?

## 2022-09-08 MED ORDER — LEVOCETIRIZINE DIHYDROCHLORIDE 5 MG PO TABS
5.0000 mg | ORAL_TABLET | Freq: Every day | ORAL | 11 refills | Status: AC
Start: 2022-09-08 — End: ?

## 2022-09-08 MED ORDER — PERPHENAZINE 4 MG PO TABS
4.0000 mg | ORAL_TABLET | Freq: Three times a day (TID) | ORAL | 1 refills | Status: AC
Start: 2022-09-08 — End: ?

## 2022-09-08 MED ORDER — SHINGRIX 50 MCG/0.5ML IM SUSR
INTRAMUSCULAR | 1 refills | Status: AC
Start: 2022-09-08 — End: ?

## 2022-09-08 NOTE — Assessment & Plan Note (Signed)
Losing weight, doing well with exercising. Very motivated. Checking labs as below

## 2022-09-08 NOTE — Assessment & Plan Note (Signed)
Doing well overall, has been losing weight. Checking lipid panel, CBC, BMP, A1c.

## 2022-09-08 NOTE — Assessment & Plan Note (Addendum)
Received abx for infection recently, completed course, no evidence of active infection on exam. VSS. Saw dentist who is planning for some procedures. Advised continued use of conservative pain mgmt with tylenol/ibuprofen and good oral hygiene. Pt plans to f/u with dentist.

## 2022-09-08 NOTE — Progress Notes (Signed)
SUBJECTIVE:   CHIEF COMPLAINT / HPI:   Tooth pain -Ongoing several months -Received abx for tooth infection in feb, swelling resolved, no fevers -Continues to have some pain especially in his gums with brushing. No bleeding, difficulty eating -Saw dentist who is recommending some procedures  Annual Physical -doing well overall -Losing weight, going to [facility] regularly, exercising more, eating better. Traveling back and forth between here and [place] -interested in colonoscopy and shingles vaccine -Denies recent CP/SOB, fevers, headaches/vision changes, n/v, abdominal pain, lower extremity swelling. No issues with sleeping   PERTINENT  PMH / PSH:   Past Medical History:  Diagnosis Date   Mental disability    Schizophrenia (HCC) 1993    OBJECTIVE:  BP 126/72   Pulse 61   Ht 5\' 10"  (1.778 m)   Wt (!) 323 lb 9.6 oz (146.8 kg)   SpO2 98%   BMI 46.43 kg/m   General: NAD, pleasant, able to participate in exam Cardiac: RRR, no murmurs auscultated Respiratory: CTAB, normal WOB Abdomen: soft, non-tender, non-distended, normoactive bowel sounds Extremities: warm and well perfused, no edema or cyanosis Skin: warm and dry, no rashes noted Neuro: alert, no obvious focal deficits, speech normal Psych: Normal affect and mood  ASSESSMENT/PLAN:   1. Tooth pain Received abx for infection recently, completed course, no evidence of active infection on exam. VSS. Saw dentist who is planning for some procedures. Suspect component of gingivitis as well given pain with brushing. Advised continued use of conservative pain mgmt with tylenol/ibuprofen and good oral hygiene. Pt plans to f/u with dentist.   - ibuprofen (ADVIL) 600 MG tablet; Take 1 tablet (600 mg total) by mouth every 8 (eight) hours as needed.  Dispense: 30 tablet; Refill: 0  2. PE (physical exam), annual Doing well overall, has been losing weight. Checking lipid panel, CBC, CMP, A1c. Placed referral for colonoscopy. Provided  shingles vaccine prescription  3. Screening for metabolic disorder - CBC with Differential - Lipid Panel - Comprehensive metabolic panel   4. Need for shingles vaccine  - Zoster Vaccine Adjuvanted [facility]) injection; Administer Shingrix vaccination now and repeat in two months  Dispense: 1 each; Refill: 1  5. Encounter for screening colonoscopy  - Ambulatory referral to Gastroenterology  Provided COVID booster today and refilled meds for chronic back pain, anxiety/mood disorder, seasonal allergies as below  Meds ordered this encounter  Medications   cyclobenzaprine (FLEXERIL) 5 MG tablet    Sig: Take 1 tablet (5 mg total) by mouth 3 (three) times daily as needed for muscle spasms.    Dispense:  30 tablet    Refill:  1   busPIRone (BUSPAR) 5 MG tablet    Sig: Take 1 tablet (5 mg total) by mouth 3 (three) times daily.    Dispense:  30 tablet    Refill:  1   levocetirizine (XYZAL) 5 MG tablet    Sig: Take 1 tablet (5 mg total) by mouth daily.    Dispense:  30 tablet    Refill:  11   perphenazine (TRILAFON) 4 MG tablet    Sig: Take 1 tablet (4 mg total) by mouth 3 (three) times daily.    Dispense:  30 tablet    Refill:  1   ibuprofen (ADVIL) 600 MG tablet    Sig: Take 1 tablet (600 mg total) by mouth every 8 (eight) hours as needed.    Dispense:  30 tablet    Refill:  0   Zoster Vaccine Adjuvanted [facility]) injection  Sig: Administer Shingrix vaccination now and repeat in two months    Dispense:  1 each    Refill:  1   Return in about 1 year (around 09/08/2023).  Vonna Drafts, MD [facility]

## 2022-09-08 NOTE — Patient Instructions (Addendum)
We are checking some blood work today.  If anything is abnormal, I will give you a call.  If everything is normal, you will receive a letter in the mail.  I have placed a referral for you to receive a colonoscopy.  Please expect a phone call about this within the next couple of weeks.  Please visit your local pharmacy to receive your shingles vaccine and take the printed prescription with you.  Congratulations on your success with weight loss and exercise.  Keep it up!  You can try using mouthwash that contains hydrogen peroxide for your gum pain and any canker sores/ulcers. Please try to follow up soon with your dentist.    Vonna Drafts, MD

## 2022-09-09 LAB — CBC WITH DIFFERENTIAL/PLATELET
Basophils Absolute: 0 10*3/uL (ref 0.0–0.2)
Basos: 0 %
EOS (ABSOLUTE): 0.4 10*3/uL (ref 0.0–0.4)
Eos: 7 %
Hematocrit: 44.8 % (ref 37.5–51.0)
Hemoglobin: 15.2 g/dL (ref 13.0–17.7)
Immature Grans (Abs): 0 10*3/uL (ref 0.0–0.1)
Immature Granulocytes: 0 %
Lymphocytes Absolute: 1.6 10*3/uL (ref 0.7–3.1)
Lymphs: 32 %
MCH: 28.9 pg (ref 26.6–33.0)
MCHC: 33.9 g/dL (ref 31.5–35.7)
MCV: 85 fL (ref 79–97)
Monocytes Absolute: 0.5 10*3/uL (ref 0.1–0.9)
Monocytes: 9 %
Neutrophils Absolute: 2.6 10*3/uL (ref 1.4–7.0)
Neutrophils: 52 %
Platelets: 216 10*3/uL (ref 150–450)
RBC: 5.26 x10E6/uL (ref 4.14–5.80)
RDW: 13.1 % (ref 11.6–15.4)
WBC: 5.1 10*3/uL (ref 3.4–10.8)

## 2022-09-09 LAB — COMPREHENSIVE METABOLIC PANEL
ALT: 22 IU/L (ref 0–44)
AST: 25 IU/L (ref 0–40)
Albumin/Globulin Ratio: 1.6 (ref 1.2–2.2)
Albumin: 4.4 g/dL (ref 4.1–5.1)
Alkaline Phosphatase: 118 IU/L (ref 44–121)
BUN/Creatinine Ratio: 17 (ref 9–20)
BUN: 17 mg/dL (ref 6–24)
Bilirubin Total: 0.6 mg/dL (ref 0.0–1.2)
CO2: 23 mmol/L (ref 20–29)
Calcium: 9.4 mg/dL (ref 8.7–10.2)
Chloride: 102 mmol/L (ref 96–106)
Creatinine, Ser: 1 mg/dL (ref 0.76–1.27)
Globulin, Total: 2.8 g/dL (ref 1.5–4.5)
Glucose: 79 mg/dL (ref 70–99)
Potassium: 4.9 mmol/L (ref 3.5–5.2)
Sodium: 141 mmol/L (ref 134–144)
Total Protein: 7.2 g/dL (ref 6.0–8.5)
eGFR: 92 mL/min/{1.73_m2} (ref >59)

## 2022-09-09 LAB — LIPID PANEL
Chol/HDL Ratio: 3.8 ratio (ref 0.0–5.0)
Cholesterol, Total: 179 mg/dL (ref 100–199)
HDL: 47 mg/dL (ref >39)
LDL Chol Calc (NIH): 119 mg/dL — ABNORMAL HIGH (ref 0–99)
Triglycerides: 71 mg/dL (ref 0–149)
VLDL Cholesterol Cal: 13 mg/dL (ref 5–40)

## 2022-09-09 LAB — HEMOGLOBIN A1C
Est. average glucose Bld gHb Est-mCnc: 117 mg/dL
Hgb A1c MFr Bld: 5.7 % — ABNORMAL HIGH (ref 4.8–5.6)

## 2022-09-12 ENCOUNTER — Encounter: Payer: Self-pay | Admitting: Family Medicine

## 2022-10-31 ENCOUNTER — Ambulatory Visit (INDEPENDENT_AMBULATORY_CARE_PROVIDER_SITE_OTHER): Payer: Medicare Other

## 2022-10-31 VITALS — Ht 70.0 in | Wt 323.0 lb

## 2022-10-31 DIAGNOSIS — Z0100 Encounter for examination of eyes and vision without abnormal findings: Secondary | ICD-10-CM

## 2022-10-31 DIAGNOSIS — Z0000 Encounter for general adult medical examination without abnormal findings: Secondary | ICD-10-CM

## 2022-10-31 NOTE — Patient Instructions (Signed)
Charles Hernandez , Thank you for taking time to come for your Medicare Wellness Visit. I appreciate your ongoing commitment to your health goals. Please review the following plan we discussed and let me know if I can assist you in the future.   These are the goals we discussed:  Goals      Increase physical activity        This is a list of the screening recommended for you and due dates:  Health Maintenance  Topic Date Due   Colon Cancer Screening  Never done   DTaP/Tdap/Td vaccine (2 - Tdap) 06/16/2019   Zoster (Shingles) Vaccine (2 of 2) 11/09/2022   Flu Shot  12/01/2022   Medicare Annual Wellness Visit  10/31/2023   COVID-19 Vaccine  Completed   Hepatitis C Screening  Completed   HIV Screening  Completed   HPV Vaccine  Aged Out    Advanced directives: Information on Advanced Care Planning can be found at [facility] Advance Health Care Directives Advance Health Care Directives ([email or web address] Please bring a copy of your health care power of attorney and living will to the office to be added to your chart at your convenience.  Conditions/risks identified: Aim for 30 minutes of exercise or brisk walking, 6-8 glasses of water, and 5 servings of fruits and vegetables each day.  Next appointment: Follow up in one year for your annual wellness visit   Preventive Care 40-64 Years, Male Preventive care refers to lifestyle choices and visits with your health care provider that can promote health and wellness. What does preventive care include? A yearly physical exam. This is also called an annual well check. Dental exams once or twice a year. Routine eye exams. Ask your health care provider how often you should have your eyes checked. Personal lifestyle choices, including: Daily care of your teeth and gums. Regular physical activity. Eating a healthy diet. Avoiding tobacco and drug use. Limiting alcohol use. Practicing safe sex. Taking low-dose aspirin every day  starting at age 40. What happens during an annual well check? The services and screenings done by your health care provider during your annual well check will depend on your age, overall health, lifestyle risk factors, and family history of disease. Counseling  Your health care provider may ask you questions about your: Alcohol use. Tobacco use. Drug use. Emotional well-being. Home and relationship well-being. Sexual activity. Eating habits. Work and work Astronomer. Screening  You may have the following tests or measurements: Height, weight, and BMI. Blood pressure. Lipid and cholesterol levels. These may be checked every 5 years, or more frequently if you are over 44 years old. Skin check. Lung cancer screening. You may have this screening every year starting at age 26 if you have a 30-pack-year history of smoking and currently smoke or have quit within the past 15 years. Fecal occult blood test (FOBT) of the stool. You may have this test every year starting at age 43. Flexible sigmoidoscopy or colonoscopy. You may have a sigmoidoscopy every 5 years or a colonoscopy every 10 years starting at age 61. Prostate cancer screening. Recommendations will vary depending on your family history and other risks. Hepatitis C blood test. Hepatitis B blood test. Sexually transmitted disease (STD) testing. Diabetes screening. This is done by checking your blood sugar (glucose) after you have not eaten for a while (fasting). You may have this done every 1-3 years. Discuss your test results, treatment options, and if necessary, the need for more  tests with your health care provider. Vaccines  Your health care provider may recommend certain vaccines, such as: Influenza vaccine. This is recommended every year. Tetanus, diphtheria, and acellular pertussis (Tdap, Td) vaccine. You may need a Td booster every 10 years. Zoster vaccine. You may need this after age 55. Pneumococcal 13-valent conjugate  (PCV13) vaccine. You may need this if you have certain conditions and have not been vaccinated. Pneumococcal polysaccharide (PPSV23) vaccine. You may need one or two doses if you smoke cigarettes or if you have certain conditions. Talk to your health care provider about which screenings and vaccines you need and how often you need them. This information is not intended to replace advice given to you by your health care provider. Make sure you discuss any questions you have with your health care provider. Document Released: 05/15/2015 Document Revised: 01/06/2016 Document Reviewed: 02/17/2015 Elsevier Interactive Patient Education  2017 [employer].  Fall Prevention in the Home Falls can cause injuries. They can happen to people of all ages. There are many things you can do to make your home safe and to help prevent falls. What can I do on the outside of my home? Regularly fix the edges of walkways and driveways and fix any cracks. Remove anything that might make you trip as you walk through a door, such as a raised step or threshold. Trim any bushes or trees on the path to your home. Use bright outdoor lighting. Clear any walking paths of anything that might make someone trip, such as rocks or tools. Regularly check to see if handrails are loose or broken. Make sure that both sides of any steps have handrails. Any raised decks and porches should have guardrails on the edges. Have any leaves, snow, or ice cleared regularly. Use sand or salt on walking paths during winter. Clean up any spills in your garage right away. This includes oil or grease spills. What can I do in the bathroom? Use night lights. Install grab bars by the toilet and in the tub and shower. Do not use towel bars as grab bars. Use non-skid mats or decals in the tub or shower. If you need to sit down in the shower, use a plastic, non-slip stool. Keep the floor dry. Clean up any water that spills on the floor as soon as it  happens. Remove soap buildup in the tub or shower regularly. Attach bath mats securely with double-sided non-slip rug tape. Do not have throw rugs and other things on the floor that can make you trip. What can I do in the bedroom? Use night lights. Make sure that you have a light by your bed that is easy to reach. Do not use any sheets or blankets that are too big for your bed. They should not hang down onto the floor. Have a firm chair that has side arms. You can use this for support while you get dressed. Do not have throw rugs and other things on the floor that can make you trip. What can I do in the kitchen? Clean up any spills right away. Avoid walking on wet floors. Keep items that you use a lot in easy-to-reach places. If you need to reach something above you, use a strong step stool that has a grab bar. Keep electrical cords out of the way. Do not use floor polish or wax that makes floors slippery. If you must use wax, use non-skid floor wax. Do not have throw rugs and other things on the floor  that can make you trip. What can I do with my stairs? Do not leave any items on the stairs. Make sure that there are handrails on both sides of the stairs and use them. Fix handrails that are broken or loose. Make sure that handrails are as long as the stairways. Check any carpeting to make sure that it is firmly attached to the stairs. Fix any carpet that is loose or worn. Avoid having throw rugs at the top or bottom of the stairs. If you do have throw rugs, attach them to the floor with carpet tape. Make sure that you have a light switch at the top of the stairs and the bottom of the stairs. If you do not have them, ask someone to add them for you. What else can I do to help prevent falls? Wear shoes that: Do not have high heels. Have rubber bottoms. Are comfortable and fit you well. Are closed at the toe. Do not wear sandals. If you use a stepladder: Make sure that it is fully opened.  Do not climb a closed stepladder. Make sure that both sides of the stepladder are locked into place. Ask someone to hold it for you, if possible. Clearly mark and make sure that you can see: Any grab bars or handrails. First and last steps. Where the edge of each step is. Use tools that help you move around (mobility aids) if they are needed. These include: Canes. Walkers. Scooters. Crutches. Turn on the lights when you go into a dark area. Replace any light bulbs as soon as they burn out. Set up your furniture so you have a clear path. Avoid moving your furniture around. If any of your floors are uneven, fix them. If there are any pets around you, be aware of where they are. Review your medicines with your doctor. Some medicines can make you feel dizzy. This can increase your chance of falling. Ask your doctor what other things that you can do to help prevent falls. This information is not intended to replace advice given to you by your health care provider. Make sure you discuss any questions you have with your health care provider. Document Released: 02/12/2009 Document Revised: 09/24/2015 Document Reviewed: 05/23/2014 Elsevier Interactive Patient Education  2017 [employer].

## 2022-10-31 NOTE — Progress Notes (Signed)
Subjective:   Charles Hernandez is a 52 y.o. male who presents for an Initial Medicare Annual Wellness Visit.  Visit Complete: Virtual  I connected with  [name] on 10/31/22 by a audio enabled telemedicine application and verified that I am speaking with the correct person using two identifiers.  Patient Location: Home  Provider Location: Home Office  I discussed the limitations of evaluation and management by telemedicine. The patient expressed understanding and agreed to proceed.  Review of Systems     Cardiac Risk Factors include: sedentary lifestyle;obesity (BMI >30kg/m2);male gender     Objective:    Today's Vitals   10/31/22 1919  Weight: (!) 323 lb (146.5 kg)  Height: 5\' 10"  (1.778 m)   Body mass index is 46.35 kg/m.     10/31/2022    7:23 PM 09/08/2022    1:45 PM 12/03/2019   10:56 AM 10/11/2019    8:31 AM 06/12/2019   10:05 AM 10/24/2017    8:36 AM 02/13/2017    1:59 PM  Advanced Directives  Does Patient Have a Medical Advance Directive? No No No No No No No  Would patient like information on creating a medical advance directive? Yes (MAU/Ambulatory/Procedural Areas - Information given)  No - Patient declined No - Patient declined No - Patient declined No - Patient declined No - Patient declined    Current Medications (verified) Outpatient Encounter Medications as of 10/31/2022  Medication Sig   busPIRone (BUSPAR) 5 MG tablet Take 1 tablet (5 mg total) by mouth 3 (three) times daily.   carbamide peroxide (DEBROX) 6.5 % OTIC solution Place 5 drops into the left ear daily.   cyclobenzaprine (FLEXERIL) 5 MG tablet Take 1 tablet (5 mg total) by mouth 3 (three) times daily as needed for muscle spasms.   Elastic Bandages & Supports (MEDICAL COMPRESSION STOCKINGS) MISC Please wear these compression stocking during the day.   ibuprofen (ADVIL) 600 MG tablet Take 1 tablet (600 mg total) by mouth every 8 (eight) hours as needed.   levocetirizine (XYZAL) 5 MG tablet Take 1 tablet  (5 mg total) by mouth daily.   perphenazine (TRILAFON) 4 MG tablet Take 1 tablet (4 mg total) by mouth 3 (three) times daily.   naproxen (NAPROSYN) 500 MG tablet Take 1 tablet (500 mg total) by mouth 2 (two) times daily with a meal. (Patient not taking: Reported on 09/08/2022)   Zoster Vaccine Adjuvanted [facility]) injection Administer Shingrix vaccination now and repeat in two months (Patient not taking: Reported on 10/31/2022)   No facility-administered encounter medications on file as of 10/31/2022.    Allergies (verified) Patient has no known allergies.   History: Past Medical History:  Diagnosis Date   Mental disability    Schizophrenia (HCC) 1993   History reviewed. No pertinent surgical history. Family History  Problem Relation Age of Onset   Cancer Mother    Cancer Sister    Social History   Socioeconomic History   Marital status: Single    Spouse name: Not on file   Number of children: Not on file   Years of education: Not on file   Highest education level: Not on file  Occupational History   Not on file  Tobacco Use   Smoking status: Never   Smokeless tobacco: Never  Substance and Sexual Activity   Alcohol use: No   Drug use: No   Sexual activity: Not Currently  Other Topics Concern   Not on file  Social History Narrative  Lives in [place].  Family lives in [place].  He is on disability for mental illness.   Social Determinants of Health   Financial Resource Strain: Low Risk  (10/31/2022)   Overall Financial Resource Strain (CARDIA)    Difficulty of Paying Living Expenses: Not hard at all  Food Insecurity: No Food Insecurity (10/31/2022)   Hunger Vital Sign    Worried About Running Out of Food in the Last Year: Never true    Ran Out of Food in the Last Year: Never true  Transportation Needs: No Transportation Needs (10/31/2022)   PRAPARE - Administrator, Civil Service (Medical): No    Lack of Transportation (Non-Medical): No  Physical Activity:  Insufficiently Active (10/31/2022)   Exercise Vital Sign    Days of Exercise per Week: 3 days    Minutes of Exercise per Session: 30 min  Stress: No Stress Concern Present (10/31/2022)   [employer] of Occupational Health - Occupational Stress Questionnaire    Feeling of Stress : Not at all  Social Connections: Moderately Isolated (10/31/2022)   Social Connection and Isolation Panel [NHANES]    Frequency of Communication with Friends and Family: More than three times a week    Frequency of Social Gatherings with Friends and Family: Three times a week    Attends Religious Services: 1 to 4 times per year    Active Member of Clubs or Organizations: No    Attends Banker Meetings: Never    Marital Status: Never married    Tobacco Counseling Counseling given: Not Answered   Clinical Intake:  Pre-visit preparation completed: Yes  Pain : No/denies pain     Diabetes: No  How often do you need to have someone help you when you read instructions, pamphlets, or other written materials from your doctor or pharmacy?: 1 - Never  Interpreter Needed?: No  Information entered by :: Kandis Fantasia LPN   Activities of Daily Living    10/31/2022    7:21 PM  In your present state of health, do you have any difficulty performing the following activities:  Hearing? 0  Vision? 0  Difficulty concentrating or making decisions? 0  Walking or climbing stairs? 0  Dressing or bathing? 0  Doing errands, shopping? 0  Preparing Food and eating ? N  Using the Toilet? N  In the past six months, have you accidently leaked urine? N  Do you have problems with loss of bowel control? N  Managing your Medications? N  Managing your Finances? N  Housekeeping or managing your Housekeeping? N    Patient Care Team: Vonna Drafts, MD as PCP - General (Family Medicine)  Indicate any recent Medical Services you may have received from other than [facility] providers in the past year (date may be  approximate).     Assessment:   This is a routine wellness examination for [name].  Hearing/Vision screen Hearing Screening - Comments:: Denies hearing difficulties   Vision Screening - Comments:: No vision problems; will schedule routine eye exam soon    Dietary issues and exercise activities discussed:     Goals Addressed             This Visit's Progress    Increase physical activity        Depression Screen    10/31/2022    7:22 PM 09/08/2022    1:50 PM 04/23/2021   11:20 AM 12/03/2019   10:56 AM 10/23/2019    2:06 PM 10/18/2019  9:16 AM 06/12/2019   10:07 AM  PHQ 2/9 Scores  PHQ - 2 Score 0 0 0 0 0 0 0  PHQ- 9 Score 0 0 1 0       Fall Risk    10/31/2022    7:24 PM 10/11/2019    8:31 AM 06/12/2019   10:06 AM 04/15/2019    8:53 AM 10/04/2016    1:59 PM  Fall Risk   Falls in the past year? 0 0 0 1 No  Number falls in past yr: 0 0 0 0   Injury with Fall? 0 0 0 0   Risk for fall due to : No Fall Risks      Follow up Falls prevention discussed;Education provided;Falls evaluation completed   Falls evaluation completed     MEDICARE RISK AT HOME:  Medicare Risk at Home - 10/31/22 1924     Any stairs in or around the home? Yes    If so, are there any without handrails? No    Home free of loose throw rugs in walkways, pet beds, electrical cords, etc? Yes    Adequate lighting in your home to reduce risk of falls? Yes    Life alert? No    Use of a cane, walker or w/c? No    Grab bars in the bathroom? No    Shower chair or bench in shower? No    Elevated toilet seat or a handicapped toilet? No             TIMED UP AND GO:  Was the test performed? No    Cognitive Function:        10/31/2022    7:24 PM  6CIT Screen  What Year? 0 points  What month? 0 points  What time? 0 points  Count back from 20 0 points  Months in reverse 0 points  Repeat phrase 0 points  Total Score 0 points    Immunizations Immunization History  Administered Date(s)  Administered   COVID-19, mRNA, vaccine(Comirnaty)12 years and older 09/08/2022   Influenza Split 05/07/2012   Influenza Whole 06/15/2009   Influenza,inj,Quad PF,6+ Mos 02/13/2017, 06/12/2019, 02/25/2020, 04/23/2021   PFIZER Comirnaty(Gray Top)Covid-19 Tri-Sucrose Vaccine 12/04/2019, 12/26/2019   Td 06/15/2009   Zoster Recombinant(Shingrix) 09/14/2022    TDAP status: Due, Education has been provided regarding the importance of this vaccine. Advised may receive this vaccine at local pharmacy or Health Dept. Aware to provide a copy of the vaccination record if obtained from local pharmacy or Health Dept. Verbalized acceptance and understanding.  Pneumococcal vaccine status: Up to date  Covid-19 vaccine status: Information provided on how to obtain vaccines.   Qualifies for Shingles Vaccine? Yes   Zostavax completed No   Shingrix Completed?: No.    Education has been provided regarding the importance of this vaccine. Patient has been advised to call insurance company to determine out of pocket expense if they have not yet received this vaccine. Advised may also receive vaccine at local pharmacy or Health Dept. Verbalized acceptance and understanding.  Screening Tests Health Maintenance  Topic Date Due   Colonoscopy  Never done   DTaP/Tdap/Td (2 - Tdap) 06/16/2019   Zoster Vaccines- Shingrix (2 of 2) 11/09/2022   INFLUENZA VACCINE  12/01/2022   Medicare Annual Wellness (AWV)  10/31/2023   COVID-19 Vaccine  Completed   Hepatitis C Screening  Completed   HIV Screening  Completed   HPV VACCINES  Aged Out    Health Maintenance  Health  Maintenance Due  Topic Date Due   Colonoscopy  Never done   DTaP/Tdap/Td (2 - Tdap) 06/16/2019    Colorectal cancer screening: Referral to GI placed 09/08/22. Pt aware the office will call re: appt.  Lung Cancer Screening: (Low Dose CT Chest recommended if Age 29-80 years, 20 pack-year currently smoking OR have quit w/in 15years.) does not qualify.    Lung Cancer Screening Referral: n/a  Additional Screening:  Hepatitis C Screening: does qualify; Completed 12/03/19  Vision Screening: Recommended annual ophthalmology exams for early detection of glaucoma and other disorders of the eye. Is the patient up to date with their annual eye exam?  No  Who is the provider or what is the name of the office in which the patient attends annual eye exams? none If pt is not established with a provider, would they like to be referred to a provider to establish care? Yes .   Dental Screening: Recommended annual dental exams for proper oral hygiene  Community Resource Referral / Chronic Care Management: CRR required this visit?  No   CCM required this visit?  No    Plan:     I have personally reviewed and noted the following in the patient's chart:   Medical and social history Use of alcohol, tobacco or illicit drugs  Current medications and supplements including opioid prescriptions. Patient is not currently taking opioid prescriptions. Functional ability and status Nutritional status Physical activity Advanced directives List of other physicians Hospitalizations, surgeries, and ER visits in previous 12 months Vitals Screenings to include cognitive, depression, and falls Referrals and appointments  In addition, I have reviewed and discussed with patient certain preventive protocols, quality metrics, and best practice recommendations. A written personalized care plan for preventive services as well as general preventive health recommendations were provided to patient.     Kandis Fantasia Miranda, [place]   4/0/9811   After Visit Summary: (MyChart) Due to this being a telephonic visit, the after visit summary with patients personalized plan was offered to patient via MyChart   Nurse Notes: No concerns

## 2023-01-06 ENCOUNTER — Telehealth: Payer: Self-pay | Admitting: Family Medicine

## 2023-01-06 NOTE — Telephone Encounter (Signed)
Patient came in asking about getting a referral for a foot doctor.

## 2023-01-06 NOTE — Telephone Encounter (Signed)
Contacted the patient back to try to schedule appt but his mother answered the phone. I informed her to let him know to contact the office back when he get a chance. When he does he will need to schedule an appt per referral that he wanted.

## 2023-01-10 ENCOUNTER — Encounter: Payer: Self-pay | Admitting: Family Medicine

## 2023-01-10 ENCOUNTER — Ambulatory Visit (INDEPENDENT_AMBULATORY_CARE_PROVIDER_SITE_OTHER): Payer: Medicare Other | Admitting: Family Medicine

## 2023-01-10 VITALS — BP 134/79 | HR 65 | Ht 70.0 in | Wt 335.0 lb

## 2023-01-10 DIAGNOSIS — M2141 Flat foot [pes planus] (acquired), right foot: Secondary | ICD-10-CM

## 2023-01-10 DIAGNOSIS — Z23 Encounter for immunization: Secondary | ICD-10-CM | POA: Diagnosis present

## 2023-01-10 DIAGNOSIS — M2142 Flat foot [pes planus] (acquired), left foot: Secondary | ICD-10-CM | POA: Diagnosis not present

## 2023-01-10 DIAGNOSIS — Z1211 Encounter for screening for malignant neoplasm of colon: Secondary | ICD-10-CM

## 2023-01-10 NOTE — Patient Instructions (Addendum)
It was great to see you! Thank you for allowing me to participate in your care!  Our plans for today:  - You can take Miralax daily as needed for hard stools. You may also take fiber supplements to help. - I have sent a referral to podiatry, they should call you to schedule an appointment. - I have sent a referral for a Colonoscopy, they should call you in the next 2 weeks to schedule an appointment.   Please arrive 15 minutes PRIOR to your next scheduled appointment time! If you do not, this affects OTHER patients' care.  Take care and seek immediate care sooner if you develop any concerns.   Celine Mans, MD, PGY-2 [facility] 1:36 PM 01/10/2023  [facility]

## 2023-01-10 NOTE — Progress Notes (Signed)
    SUBJECTIVE:   CHIEF COMPLAINT / HPI: Wants podiatry referral  Long history of plantar fasciitis. Would like to see foot doctor. States he has flat feet. Wants foot support for arches.  Previously had corn on big toe of right foot. Was on feet a lot for folk festival this weekend, which aggravated. Would like taylor made orthotics.  Currently getting significant benefit from wearing new focus.  Would like Referral for Colonoscopy.  Requests male provider.  PERTINENT  PMH / PSH: Left plantar fasciitis, calcaneal bone spur right foot  OBJECTIVE:   BP (!) 116/91   Pulse 65   Ht 5\' 10"  (1.778 m)   Wt (!) 355 lb (161 kg)   SpO2 99%   BMI 50.94 kg/m   General: NAD, well appearing Neuro: A&O Respiratory: normal WOB on RA Extremities: Moving all 4 extremities equally Bilateral feet: 1+ bilateral pitting edema, 2+ DP pulses, pes planus, no obvious deformity, bruising, erythema, localized swelling  ASSESSMENT/PLAN:   Assessment & Plan Pes planus of both feet Patient interested in discussing custom orthotics to help with his feet.  Explained that this will likely not be covered by insurance but is reasonable to talk with a podiatrist.  Exam not consistent with current plantar fasciitis.  Referral placed to podiatry. Screen for colon cancer Referral for screening colonoscopy placed.  Return in about 2 months (around 03/12/2023).  Celine Mans, MD [facility]

## 2023-01-14 ENCOUNTER — Emergency Department (HOSPITAL_BASED_OUTPATIENT_CLINIC_OR_DEPARTMENT_OTHER)
Admission: EM | Admit: 2023-01-14 | Discharge: 2023-01-14 | Disposition: A | Discharge status: Home / Self Care | Payer: Medicare Other | Attending: Emergency Medicine | Admitting: Emergency Medicine

## 2023-01-14 ENCOUNTER — Encounter (HOSPITAL_BASED_OUTPATIENT_CLINIC_OR_DEPARTMENT_OTHER): Payer: Self-pay

## 2023-01-14 ENCOUNTER — Other Ambulatory Visit: Payer: Self-pay

## 2023-01-14 DIAGNOSIS — L03011 Cellulitis of right finger: Secondary | ICD-10-CM | POA: Insufficient documentation

## 2023-01-14 MED ORDER — ACETAMINOPHEN 325 MG PO TABS
650.0000 mg | ORAL_TABLET | Freq: Once | ORAL | Status: AC
  Administered 2023-01-14: 650 mg via ORAL
  Filled 2023-01-14: qty 2

## 2023-01-14 NOTE — ED Triage Notes (Signed)
Pt states he jammed his finger during the folk festival, "made worse by going to the gym & lifting heavy weights"  States that he is on abx (ampicillin) for cracked tooth, "coincidentally it's getting better."

## 2023-01-14 NOTE — Discharge Instructions (Signed)
To prevent this condition from happening again: Wear rubber gloves when putting your hands in water for washing dishes or other tasks. Wear gloves if your hands might touch cleaners or chemicals. Avoid injuring your nails or fingertips. Do not bite your nails or tear hangnails. Do not cut your nails very short. Do not cut the skin at the base and sides of the nail. Use clean nail clippers or scissors when trimming nails.

## 2023-01-14 NOTE — ED Notes (Signed)
Reviewed AVS/discharge instruction with patient. Time allotted for and all questions answered. Patient is agreeable for d/c and escorted to ed exit by staff.  

## 2023-01-14 NOTE — ED Provider Notes (Signed)
[place] EMERGENCY DEPARTMENT AT [facility] Provider Note   CSN: [id number] Arrival date & time: 01/14/23  2028     History  No chief complaint on file.   Charles Hernandez is a 52 y.o. male.  52 year old male with no past medical history presents to the ED with a chief complaint of right finger injury which occurred last week.  Patient reports he jammed his finger at the folk festival, then went to lift heavy weights at the gym and feels like now he has an infection to the right index finger.  There is surrounding pus to the DIP along with some swelling noted.  No systemic signs, no other injury reported.  The history is provided by the patient.       Home Medications Prior to Admission medications   Medication Sig Start Date End Date Taking? Authorizing Provider  busPIRone (BUSPAR) 5 MG tablet Take 1 tablet (5 mg total) by mouth 3 (three) times daily. 09/08/22   Vonna Drafts, MD  carbamide peroxide (DEBROX) 6.5 % OTIC solution Place 5 drops into the left ear daily. 06/12/19   Mirian Mo, MD  cyclobenzaprine (FLEXERIL) 5 MG tablet Take 1 tablet (5 mg total) by mouth 3 (three) times daily as needed for muscle spasms. 09/08/22   Vonna Drafts, MD  Elastic Bandages & Supports (MEDICAL COMPRESSION STOCKINGS) MISC Please wear these compression stocking during the day. 12/03/19   Jackelyn Poling, DO  ibuprofen (ADVIL) 600 MG tablet Take 1 tablet (600 mg total) by mouth every 8 (eight) hours as needed. 09/08/22   Vonna Drafts, MD  levocetirizine (XYZAL) 5 MG tablet Take 1 tablet (5 mg total) by mouth daily. 09/08/22   Vonna Drafts, MD  naproxen (NAPROSYN) 500 MG tablet Take 1 tablet (500 mg total) by mouth 2 (two) times daily with a meal. Patient not taking: Reported on 09/08/2022 12/17/18   Katha Cabal, DO  perphenazine (TRILAFON) 4 MG tablet Take 1 tablet (4 mg total) by mouth 3 (three) times daily. 09/08/22   Vonna Drafts, MD  Zoster Vaccine Adjuvanted [facility]) injection Administer  Shingrix vaccination now and repeat in two months Patient not taking: Reported on 10/31/2022 09/08/22   Vonna Drafts, MD      Allergies    Patient has no known allergies.    Review of Systems   Review of Systems  Constitutional:  Negative for fever.  Skin:  Positive for wound.  All other systems reviewed and are negative.   Physical Exam Updated Vital Signs BP (!) 131/96   Pulse 64   Temp 97.9 F (36.6 C)   Resp 18   SpO2 99%  Physical Exam Vitals and nursing note reviewed.  Constitutional:      Appearance: Normal appearance. He is obese.  HENT:     Head: Normocephalic and atraumatic.     Mouth/Throat:     Mouth: Mucous membranes are moist.  Cardiovascular:     Rate and Rhythm: Normal rate.  Pulmonary:     Effort: Pulmonary effort is normal.  Abdominal:     General: Abdomen is flat.  Musculoskeletal:     Cervical back: Normal range of motion and neck supple.  Skin:    General: Skin is warm and dry.     Findings: Erythema present.     Comments: Paronychia present  Neurological:     Mental Status: He is alert and oriented to person, place, and time.     ED Results / Procedures / Treatments  Labs (all labs ordered are listed, but only abnormal results are displayed) Labs Reviewed - No data to display  EKG None  Radiology No results found.  Procedures .Marland KitchenIncision and Drainage  Date/Time: 01/14/2023 8:59 PM  Performed by: Claude Manges, PA-C Authorized by: Claude Manges, PA-C   Consent:    Consent obtained:  Verbal   Consent given by:  Patient   Risks discussed:  Incomplete drainage   Alternatives discussed:  No treatment Universal protocol:    Patient identity confirmed:  Verbally with patient Location:    Type:  Abscess   Location:  Upper extremity   Upper extremity location:  Finger   Finger location:  R long finger Anesthesia:    Anesthesia method:  None Procedure type:    Complexity:  Simple Procedure details:    Incision types:  Single  straight   Wound management:  Irrigated with saline   Drainage:  Serosanguinous   Wound treatment:  Wound left open   Packing materials:  None Post-procedure details:    Procedure completion:  Tolerated well, no immediate complications     Medications Ordered in ED Medications - No data to display  ED Course/ Medical Decision Making/ A&P                                 Medical Decision Making   Patient presents to the ED with paronychia present to his right middle DIP, reports this has been there for approximately 1 week.  Not diabetic, no systemic signs, no signs of fever.  Dated the wound at length, we perform an I&D at the bedside which she tolerated well.  We discussed wound care along with strict return precautions.  Patient is hemodynamically stable for discharge.   Portions of this note were generated with Scientist, clinical (histocompatibility and immunogenetics). Dictation errors may occur despite best attempts at proofreading.   Final Clinical Impression(s) / ED Diagnoses Final diagnoses:  Paronychia of finger of right hand    Rx / DC Orders ED Discharge Orders     None         Claude Manges, Cordelia Poche 01/14/23 2100    Charlynne Pander, MD 01/14/23 [phone]

## 2023-11-06 ENCOUNTER — Ambulatory Visit: Payer: Medicare Other

## 2023-11-06 VITALS — Ht 70.0 in | Wt 335.0 lb

## 2023-11-06 DIAGNOSIS — Z0000 Encounter for general adult medical examination without abnormal findings: Secondary | ICD-10-CM

## 2023-11-06 NOTE — Patient Instructions (Signed)
 Charles Hernandez , Thank you for taking time out of your busy schedule to complete your Annual Wellness Visit with me. I enjoyed our conversation and look forward to speaking with you again next year. I, as well as your care team,  appreciate your ongoing commitment to your health goals. Please review the following plan we discussed and let me know if I can assist you in the future. Your Game plan/ To Do List    Referrals: If you haven't heard from the office you've been referred to, please reach out to them at the phone provided.   Follow up Visits: Next Medicare AWV with our clinical staff: 11/07/2024 at    Have you seen your provider in the last 6 months (3 months if uncontrolled diabetes)? No Next Office Visit with your provider: 11/22/2023 at 9:30 a.m. physical with Dr. Romelle  Clinician Recommendations:  Aim for 30 minutes of exercise or brisk walking, 6-8 glasses of water, and 5 servings of fruits and vegetables each day.       This is a list of the screening recommended for you and due dates:  Health Maintenance  Topic Date Due   Hepatitis B Vaccine (1 of 3 - 19+ 3-dose series) Never done   Colon Cancer Screening  Never done   DTaP/Tdap/Td vaccine (2 - Tdap) 06/16/2019   Zoster (Shingles) Vaccine (2 of 2) 11/09/2022   COVID-19 Vaccine (4 - 2024-25 season) 01/01/2023   Flu Shot  12/01/2023   Medicare Annual Wellness Visit  11/05/2024   Hepatitis C Screening  Completed   HIV Screening  Completed   HPV Vaccine  Aged Out   Meningitis B Vaccine  Aged Out    Advanced directives: (Declined) Advance directive discussed with you today. Even though you declined this today, please call our office should you change your mind, and we can give you the proper paperwork for you to fill out. Advance Care Planning is important because it:  [x]  Makes sure you receive the medical care that is consistent with your values, goals, and preferences  [x]  It provides guidance to your family and loved ones and  reduces their decisional burden about whether or not they are making the right decisions based on your wishes.  Follow the link provided in your after visit summary or read over the paperwork we have mailed to you to help you started getting your Advance Directives in place. If you need assistance in completing these, please reach out to us  so that we can help you!  See attachments for Preventive Care and Fall Prevention Tips.

## 2023-11-06 NOTE — Progress Notes (Signed)
 Because this visit was a virtual/telehealth visit,  certain criteria was not obtained, such a blood pressure, CBG if applicable, and timed get up and go. Any medications not marked as taking were not mentioned during the medication reconciliation part of the visit. Any vitals not documented were not able to be obtained due to this being a telehealth visit or patient was unable to self-report a recent blood pressure reading due to a lack of equipment at home via telehealth. Vitals that have been documented are verbally provided by the patient.   Subjective:   Charles Hernandez is a 53 y.o. who presents for a Medicare Wellness preventive visit.  As a reminder, Annual Wellness Visits don't include a physical exam, and some assessments may be limited, especially if this visit is performed virtually. We may recommend an in-person follow-up visit with your provider if needed.  Visit Complete: Virtual I connected with  Charles Hernandez on 11/06/23 by a audio enabled telemedicine application and verified that I am speaking with the correct person using two identifiers.  Patient Location: Home  Provider Location: Home Office  I discussed the limitations of evaluation and management by telemedicine. The patient expressed understanding and agreed to proceed.  Vital Signs: Because this visit was a virtual/telehealth visit, some criteria may be missing or patient reported. Any vitals not documented were not able to be obtained and vitals that have been documented are patient reported.  VideoDeclined- This patient declined Librarian, academic. Therefore the visit was completed with audio only.  Persons Participating in Visit: Patient.  AWV Questionnaire: No: Patient Medicare AWV questionnaire was not completed prior to this visit.  Cardiac Risk Factors include: advanced age (>75men, >65 women);sedentary lifestyle;male gender;obesity (BMI >30kg/m2)     Objective:    Today's Vitals    11/06/23 1304  Weight: (!) 335 lb (152 kg)  Height: 5' 10 (1.778 m)  PainSc: 5   PainLoc: Leg   Body mass index is 48.07 kg/m.     11/06/2023    1:18 PM 01/10/2023    1:20 PM 10/31/2022    7:23 PM 09/08/2022    1:45 PM 12/03/2019   10:56 AM 10/11/2019    8:31 AM 06/12/2019   10:05 AM  Advanced Directives  Does Patient Have a Medical Advance Directive? No No No No No No No  Would patient like information on creating a medical advance directive? No - Patient declined No - Patient declined Yes (MAU/Ambulatory/Procedural Areas - Information given)  No - Patient declined No - Patient declined No - Patient declined    Current Medications (verified) Outpatient Encounter Medications as of 11/06/2023  Medication Sig   busPIRone  (BUSPAR ) 5 MG tablet Take 1 tablet (5 mg total) by mouth 3 (three) times daily.   carbamide peroxide (DEBROX) 6.5 % OTIC solution Place 5 drops into the left ear daily.   cyclobenzaprine  (FLEXERIL ) 5 MG tablet Take 1 tablet (5 mg total) by mouth 3 (three) times daily as needed for muscle spasms.   Elastic Bandages & Supports (MEDICAL COMPRESSION STOCKINGS) MISC Please wear these compression stocking during the day.   ibuprofen  (ADVIL ) 600 MG tablet Take 1 tablet (600 mg total) by mouth every 8 (eight) hours as needed.   levocetirizine (XYZAL ) 5 MG tablet Take 1 tablet (5 mg total) by mouth daily.   naproxen  (NAPROSYN ) 500 MG tablet Take 1 tablet (500 mg total) by mouth 2 (two) times daily with a meal. (Patient not taking: Reported on 09/08/2022)  perphenazine  (TRILAFON ) 4 MG tablet Take 1 tablet (4 mg total) by mouth 3 (three) times daily.   Zoster Vaccine Adjuvanted (SHINGRIX ) injection Administer Shingrix  vaccination now and repeat in two months (Patient not taking: Reported on 10/31/2022)   No facility-administered encounter medications on file as of 11/06/2023.    Allergies (verified) Patient has no known allergies.   History: Past Medical History:  Diagnosis Date    Mental disability    Schizophrenia (HCC) 1993   History reviewed. No pertinent surgical history. Family History  Problem Relation Age of Onset   Cancer Mother    Cancer Sister    Social History   Socioeconomic History   Marital status: Single    Spouse name: Not on file   Number of children: Not on file   Years of education: Not on file   Highest education level: Not on file  Occupational History   Not on file  Tobacco Use   Smoking status: Never   Smokeless tobacco: Never  Substance and Sexual Activity   Alcohol use: No   Drug use: No   Sexual activity: Not Currently  Other Topics Concern   Not on file  Social History Narrative   Lives in [place].  Family lives in [place] .  He is on disability for mental illness.   Social Drivers of Corporate investment banker Strain: Low Risk  (11/06/2023)   Overall Financial Resource Strain (CARDIA)    Difficulty of Paying Living Expenses: Not hard at all  Food Insecurity: No Food Insecurity (11/06/2023)   Hunger Vital Sign    Worried About Running Out of Food in the Last Year: Never true    Ran Out of Food in the Last Year: Never true  Transportation Needs: No Transportation Needs (11/06/2023)   PRAPARE - Administrator, Civil Service (Medical): No    Lack of Transportation (Non-Medical): No  Physical Activity: Insufficiently Active (11/06/2023)   Exercise Vital Sign    Days of Exercise per Week: 3 days    Minutes of Exercise per Session: 30 min  Stress: No Stress Concern Present (11/06/2023)   [employer] of Occupational Health - Occupational Stress Questionnaire    Feeling of Stress: Not at all  Social Connections: Moderately Isolated (11/06/2023)   Social Connection and Isolation Panel    Frequency of Communication with Friends and Family: More than three times a week    Frequency of Social Gatherings with Friends and Family: Three times a week    Attends Religious Services: 1 to 4 times per year    Active  Member of Clubs or Organizations: No    Attends Banker Meetings: Never    Marital Status: Never married    Tobacco Counseling Counseling given: Not Answered    Clinical Intake:     Pain Score: 5         Lab Results  Component Value Date   HGBA1C 5.7 (H) 09/08/2022   HGBA1C 5.3 04/15/2019   HGBA1C 5.5 12/25/2014               Activities of Daily Living     11/06/2023    1:22 PM  In your present state of health, do you have any difficulty performing the following activities:  Hearing? 0  Vision? 0  Difficulty concentrating or making decisions? 0  Walking or climbing stairs? 0  Dressing or bathing? 0  Doing errands, shopping? 0  Preparing Food and eating ? N  Using the Toilet? N  In the past six months, have you accidently leaked urine? N  Do you have problems with loss of bowel control? N  Managing your Medications? N  Managing your Finances? N  Housekeeping or managing your Housekeeping? N    Patient Care Team: Romelle Booty, MD as PCP - General (Family Medicine)  I have updated your Care Teams any recent Medical Services you may have received from other providers in the past year.     Assessment:   This is a routine wellness examination for Charles Hernandez.  Hearing/Vision screen Hearing Screening - Comments:: Adequate hearing. Vision Screening - Comments:: Adequate vision.    Goals Addressed             This Visit's Progress    Client understands the importance of follow-up with providers by attending scheduled visits         Depression Screen     11/06/2023    1:23 PM 01/10/2023    1:21 PM 10/31/2022    7:22 PM 09/08/2022    1:50 PM 04/23/2021   11:20 AM 12/03/2019   10:56 AM 10/23/2019    2:06 PM  PHQ 2/9 Scores  PHQ - 2 Score 0 0 0 0 0 0 0  PHQ- 9 Score 0 0 0 0 1 0     Fall Risk     11/06/2023    1:17 PM 10/31/2022    7:24 PM 10/11/2019    8:31 AM 06/12/2019   10:06 AM 04/15/2019    8:53 AM  Fall Risk   Falls in the past  year? 1 0 0 0  1   Number falls in past yr: 1 0 0 0 0   Injury with Fall? 1 0 0 0 0  Risk for fall due to : History of fall(s);Orthopedic patient;Impaired mobility No Fall Risks     Follow up Falls evaluation completed;Education provided Falls prevention discussed;Education provided;Falls evaluation completed   Falls evaluation completed      Data saved with a previous flowsheet row definition    MEDICARE RISK AT HOME:  Medicare Risk at Home Any stairs in or around the home?: Yes If so, are there any without handrails?: No Home free of loose throw rugs in walkways, pet beds, electrical cords, etc?: Yes Adequate lighting in your home to reduce risk of falls?: Yes Life alert?: No Use of a cane, walker or w/c?: No Grab bars in the bathroom?: No Shower chair or bench in shower?: No Elevated toilet seat or a handicapped toilet?: No  TIMED UP AND GO:  Was the test performed?  No  Cognitive Function: Declined/Normal: No cognitive concerns noted by patient or family. Patient alert, oriented, able to answer questions appropriately and recall recent events. No signs of memory loss or confusion.    11/06/2023    1:16 PM  MMSE - Mini Mental State Exam  Not completed: Unable to complete        11/06/2023    1:16 PM 10/31/2022    7:24 PM  6CIT Screen  What Year? 0 points 0 points  What month? 0 points 0 points  What time? 0 points 0 points  Count back from 20 0 points 0 points  Months in reverse 0 points 0 points  Repeat phrase 0 points 0 points  Total Score 0 points 0 points    Immunizations Immunization History  Administered Date(s) Administered   Influenza Split 05/07/2012   Influenza Whole 06/15/2009   Influenza,  Seasonal, Injecte, Preservative Fre 01/10/2023   Influenza,inj,Quad PF,6+ Mos 02/13/2017, 06/12/2019, 02/25/2020, 04/23/2021   PFIZER Comirnaty(Gray Top)Covid-19 Tri-Sucrose Vaccine 12/04/2019, 12/26/2019   Pfizer(Comirnaty)Fall Seasonal Vaccine 12 years and older  09/08/2022   Td 06/15/2009   Zoster Recombinant(Shingrix ) 09/14/2022    Screening Tests Health Maintenance  Topic Date Due   Hepatitis B Vaccines (1 of 3 - 19+ 3-dose series) Never done   Colonoscopy  Never done   DTaP/Tdap/Td (2 - Tdap) 06/16/2019   Zoster Vaccines- Shingrix  (2 of 2) 11/09/2022   COVID-19 Vaccine (4 - 2024-25 season) 01/01/2023   INFLUENZA VACCINE  12/01/2023   Medicare Annual Wellness (AWV)  11/05/2024   Hepatitis C Screening  Completed   HIV Screening  Completed   HPV VACCINES  Aged Out   Meningococcal B Vaccine  Aged Out    Health Maintenance  Health Maintenance Due  Topic Date Due   Hepatitis B Vaccines (1 of 3 - 19+ 3-dose series) Never done   Colonoscopy  Never done   DTaP/Tdap/Td (2 - Tdap) 06/16/2019   Zoster Vaccines- Shingrix  (2 of 2) 11/09/2022   COVID-19 Vaccine (4 - 2024-25 season) 01/01/2023   Health Maintenance Items Addressed: Yes Patient aware of current care gaps. Patient is due for the following care gaps: Dtap, Shingrix , Covid-19, Hepatitis B vaccines and Colonoscopy.  Additional Screening:  Vision Screening: Recommended annual ophthalmology exams for early detection of glaucoma and other disorders of the eye. Would you like a referral to an eye doctor? No    Dental Screening: Recommended annual dental exams for proper oral hygiene  Community Resource Referral / Chronic Care Management: CRR required this visit?  No   CCM required this visit?  No   Plan:    I have personally reviewed and noted the following in the patient's chart:   Medical and social history Use of alcohol, tobacco or illicit drugs  Current medications and supplements including opioid prescriptions. Patient is not currently taking opioid prescriptions. Functional ability and status Nutritional status Physical activity Advanced directives List of other physicians Hospitalizations, surgeries, and ER visits in previous 12 months Vitals Screenings to  include cognitive, depression, and falls Referrals and appointments  In addition, I have reviewed and discussed with patient certain preventive protocols, quality metrics, and best practice recommendations. A written personalized care plan for preventive services as well as general preventive health recommendations were provided to patient.   Charles LOISE Fuller, LPN   06/03/7972   After Visit Summary: (Declined) Due to this being a telephonic visit, with patients personalized plan was offered to patient but patient Declined AVS at this time   Notes: Patient aware of current care gaps. Patient is due for the following care gaps: Dtap, Shingrix , Covid-19, Hepatitis B vaccines and Colonoscopy. Patient has not been exercising due to fall and low back pain.

## 2023-11-06 NOTE — Progress Notes (Signed)
 Reviewed and agree with Dr Macky Lower plan.

## 2023-11-22 ENCOUNTER — Ambulatory Visit: Admitting: Family Medicine

## 2023-11-30 ENCOUNTER — Ambulatory Visit (INDEPENDENT_AMBULATORY_CARE_PROVIDER_SITE_OTHER): Payer: Self-pay | Admitting: Mental Health

## 2023-11-30 DIAGNOSIS — F209 Schizophrenia, unspecified: Secondary | ICD-10-CM
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
## 2023-12-01 ENCOUNTER — Encounter: Payer: Self-pay | Admitting: Family Medicine

## 2023-12-01 ENCOUNTER — Ambulatory Visit: Admitting: Family Medicine

## 2023-12-01 VITALS — BP 134/70 | HR 60 | Ht 70.0 in | Wt 346.8 lb

## 2023-12-01 DIAGNOSIS — M7989 Other specified soft tissue disorders: Secondary | ICD-10-CM | POA: Diagnosis not present

## 2023-12-01 DIAGNOSIS — G8929 Other chronic pain: Secondary | ICD-10-CM | POA: Diagnosis not present

## 2023-12-01 DIAGNOSIS — M545 Low back pain, unspecified: Secondary | ICD-10-CM

## 2023-12-01 DIAGNOSIS — Z0000 Encounter for general adult medical examination without abnormal findings: Secondary | ICD-10-CM

## 2023-12-01 NOTE — Progress Notes (Signed)
    SUBJECTIVE:   Chief compliant/HPI: annual examination  Charles Hernandez is a 53 y.o. who presents today for an annual exam.   Current concerns: Pt states he visited ED in [place]  recently for leg pain, states he was diagnosed with cellulitis and is taking his antibiotic, he is unsure the name. He states the rest of the bloodwork was fine and his ultrasound was negative for DVT. Pt states he will sign for records to be released  Endorses chronic back pain for which he would like to see PT  Denies significant CP, SOB, headaches  OBJECTIVE:   BP 134/70   Pulse 60   Ht 5' 10 (1.778 m)   Wt (!) 346 lb 12.8 oz (157.3 kg)   SpO2 100%   BMI 49.76 kg/m   General: NAD, pleasant, able to participate in exam Cardiac: RRR, no murmurs auscultated Respiratory: CTAB, normal WOB Abdomen: soft, non-tender, non-distended, normoactive bowel sounds Extremities: Lower extremity nonpitting edema bilaterally, symmetric, nontender to palpation or squeeze, no significant lesions or erythema/warmth Back: TTP over lumbar paraspinal muscles Skin: warm and dry, no rashes noted Neuro: alert, no obvious focal deficits, speech normal Psych: Normal affect and mood  ASSESSMENT/PLAN:   Assessment & Plan PE (physical exam), annual BP at goal Pt states he wishes to hold off on colonoscopy, diabetes screening, and any blood testing today. Testing 39yr ago was unremarkable except for prediabetes Pt states he would f/u in a few months to get up to date on everything Provided list of local dentists and optometrists per his request Chronic bilateral low back pain without sciatica PT referral per patient request, feel this would be beneficial Leg swelling Bilateral, symmetric, chronic ?cellulitis recently treated at facility in [place] Release of records signed, will review once available. Per patient DVT US  was negative Low concern for worsening or DVT today Possible venous stasis vs lymphedema, discussed compression  stockings once he finishes his abx course      Follow up in 1 year or sooner if indicated.  As above MyChart Activation: Already signed up   Payton Coward, MD [facility]

## 2023-12-01 NOTE — Patient Instructions (Addendum)
 Please reach out to a dentist and optometrist  Let me know when you are ready for colonoscopy  You should hear back within the next couple of weeks to schedule an appointment with physical therapy

## 2023-12-01 NOTE — Assessment & Plan Note (Signed)
 PT referral per patient request, feel this would be beneficial

## 2023-12-01 NOTE — Assessment & Plan Note (Signed)
 BP at goal Pt states he wishes to hold off on colonoscopy, diabetes screening, and any blood testing today. Testing 50yr ago was unremarkable except for prediabetes Pt states he would f/u in a few months to get up to date on everything Provided list of local dentists and optometrists per his request

## 2023-12-01 NOTE — Assessment & Plan Note (Signed)
 Bilateral, symmetric, chronic ?cellulitis recently treated at facility in [place] Release of records signed, will review once available. Per patient DVT US  was negative Low concern for worsening or DVT today Possible venous stasis vs lymphedema, discussed compression stockings once he finishes his abx course

## 2023-12-02 ENCOUNTER — Encounter (HOSPITAL_COMMUNITY): Payer: Self-pay

## 2023-12-02 ENCOUNTER — Ambulatory Visit (HOSPITAL_COMMUNITY): Payer: Self-pay | Admitting: Psychiatry

## 2023-12-04 ENCOUNTER — Encounter (INDEPENDENT_AMBULATORY_CARE_PROVIDER_SITE_OTHER): Admitting: Psychiatry

## 2023-12-04 ENCOUNTER — Telehealth (HOSPITAL_COMMUNITY): Payer: Self-pay | Admitting: Psychiatry

## 2023-12-04 NOTE — Telephone Encounter (Signed)
 Patient unable to complete virtual appointment with provide today due to apparent technical issue with his phone rendering camera non-functional. Called to reschedule but patient states he is going to [place]  tomorrow for a month and needs medications. Suggested he return to the [facility] to be seen as provider does not have any other appointments available today. He agreed to do this and if unsuccessful he plans to follow up in [place] .

## 2023-12-28 NOTE — Progress Notes (Signed)
 Not seen had technical issues.

## 2024-11-07 ENCOUNTER — Encounter
